# Patient Record
Sex: Female | Born: 1935 | Race: Black or African American | Hispanic: No | Smoking: Former smoker
Health system: Southern US, Community
[De-identification: ages and names within clinical notes are randomized; demographics above are authoritative.]

## PROBLEM LIST (undated history)

## (undated) DIAGNOSIS — N189 Chronic kidney disease, unspecified: Secondary | ICD-10-CM

## (undated) DIAGNOSIS — IMO0001 Reserved for inherently not codable concepts without codable children: Secondary | ICD-10-CM

## (undated) DIAGNOSIS — R1310 Dysphagia, unspecified: Secondary | ICD-10-CM

## (undated) DIAGNOSIS — M8580 Other specified disorders of bone density and structure, unspecified site: Secondary | ICD-10-CM

## (undated) DIAGNOSIS — G629 Polyneuropathy, unspecified: Secondary | ICD-10-CM

## (undated) DIAGNOSIS — G4700 Insomnia, unspecified: Secondary | ICD-10-CM

## (undated) DIAGNOSIS — E785 Hyperlipidemia, unspecified: Secondary | ICD-10-CM

## (undated) DIAGNOSIS — H409 Unspecified glaucoma: Secondary | ICD-10-CM

## (undated) DIAGNOSIS — D649 Anemia, unspecified: Secondary | ICD-10-CM

## (undated) DIAGNOSIS — G2581 Restless legs syndrome: Secondary | ICD-10-CM

## (undated) DIAGNOSIS — K219 Gastro-esophageal reflux disease without esophagitis: Secondary | ICD-10-CM

## (undated) DIAGNOSIS — E559 Vitamin D deficiency, unspecified: Secondary | ICD-10-CM

## (undated) DIAGNOSIS — I10 Essential (primary) hypertension: Secondary | ICD-10-CM

## (undated) DIAGNOSIS — M1990 Unspecified osteoarthritis, unspecified site: Secondary | ICD-10-CM

## (undated) DIAGNOSIS — E119 Type 2 diabetes mellitus without complications: Secondary | ICD-10-CM

## (undated) HISTORY — DX: Insomnia, unspecified: G47.00

## (undated) HISTORY — DX: Gastro-esophageal reflux disease without esophagitis: K21.9

## (undated) HISTORY — DX: Essential (primary) hypertension: I10

## (undated) HISTORY — DX: Unspecified glaucoma: H40.9

## (undated) HISTORY — DX: Other specified disorders of bone density and structure, unspecified site: M85.80

## (undated) HISTORY — DX: Restless legs syndrome: G25.81

## (undated) HISTORY — PX: BREAST LUMPECTOMY: SHX2

## (undated) HISTORY — DX: Polyneuropathy, unspecified: G62.9

## (undated) HISTORY — DX: Chronic kidney disease, unspecified: N18.9

## (undated) HISTORY — DX: Type 2 diabetes mellitus without complications: E11.9

## (undated) HISTORY — DX: Reserved for inherently not codable concepts without codable children: IMO0001

## (undated) HISTORY — DX: Anemia, unspecified: D64.9

## (undated) HISTORY — DX: Dysphagia, unspecified: R13.10

## (undated) HISTORY — PX: OTHER SURGICAL HISTORY: SHX169

## (undated) HISTORY — DX: Unspecified osteoarthritis, unspecified site: M19.90

## (undated) HISTORY — DX: Hyperlipidemia, unspecified: E78.5

## (undated) HISTORY — DX: Vitamin D deficiency, unspecified: E55.9

---

## 1998-01-04 ENCOUNTER — Ambulatory Visit (HOSPITAL_COMMUNITY): Admission: RE | Admit: 1998-01-04 | Discharge: 1998-01-04 | Payer: Self-pay | Admitting: Internal Medicine

## 1998-02-11 ENCOUNTER — Other Ambulatory Visit: Admission: RE | Admit: 1998-02-11 | Discharge: 1998-02-11 | Payer: Self-pay | Admitting: Obstetrics and Gynecology

## 1998-03-11 ENCOUNTER — Other Ambulatory Visit: Admission: RE | Admit: 1998-03-11 | Discharge: 1998-03-11 | Payer: Self-pay | Admitting: Obstetrics and Gynecology

## 1998-07-02 ENCOUNTER — Other Ambulatory Visit: Admission: RE | Admit: 1998-07-02 | Discharge: 1998-07-02 | Payer: Self-pay | Admitting: Obstetrics and Gynecology

## 1999-01-07 ENCOUNTER — Encounter: Payer: Self-pay | Admitting: Internal Medicine

## 1999-01-07 ENCOUNTER — Ambulatory Visit (HOSPITAL_COMMUNITY): Admission: RE | Admit: 1999-01-07 | Discharge: 1999-01-07 | Payer: Self-pay | Admitting: Internal Medicine

## 1999-01-27 ENCOUNTER — Other Ambulatory Visit: Admission: RE | Admit: 1999-01-27 | Discharge: 1999-01-27 | Payer: Self-pay | Admitting: Obstetrics and Gynecology

## 2000-01-13 ENCOUNTER — Encounter: Payer: Self-pay | Admitting: Internal Medicine

## 2000-01-13 ENCOUNTER — Ambulatory Visit (HOSPITAL_COMMUNITY): Admission: RE | Admit: 2000-01-13 | Discharge: 2000-01-13 | Payer: Self-pay | Admitting: Internal Medicine

## 2000-03-01 ENCOUNTER — Other Ambulatory Visit: Admission: RE | Admit: 2000-03-01 | Discharge: 2000-03-01 | Payer: Self-pay | Admitting: Obstetrics and Gynecology

## 2001-01-19 ENCOUNTER — Ambulatory Visit (HOSPITAL_COMMUNITY): Admission: RE | Admit: 2001-01-19 | Discharge: 2001-01-19 | Payer: Self-pay | Admitting: Internal Medicine

## 2001-01-19 ENCOUNTER — Encounter: Payer: Self-pay | Admitting: Internal Medicine

## 2001-03-01 ENCOUNTER — Other Ambulatory Visit: Admission: RE | Admit: 2001-03-01 | Discharge: 2001-03-01 | Payer: Self-pay | Admitting: Obstetrics and Gynecology

## 2002-03-14 ENCOUNTER — Encounter: Admission: RE | Admit: 2002-03-14 | Discharge: 2002-03-14 | Payer: Self-pay | Admitting: Internal Medicine

## 2002-03-14 ENCOUNTER — Encounter: Payer: Self-pay | Admitting: Internal Medicine

## 2002-03-17 ENCOUNTER — Ambulatory Visit (HOSPITAL_COMMUNITY): Admission: RE | Admit: 2002-03-17 | Discharge: 2002-03-17 | Payer: Self-pay | Admitting: Internal Medicine

## 2002-03-17 ENCOUNTER — Encounter: Payer: Self-pay | Admitting: Internal Medicine

## 2002-03-21 ENCOUNTER — Encounter: Payer: Self-pay | Admitting: Internal Medicine

## 2002-03-21 ENCOUNTER — Encounter: Admission: RE | Admit: 2002-03-21 | Discharge: 2002-03-21 | Payer: Self-pay | Admitting: Internal Medicine

## 2002-10-23 ENCOUNTER — Other Ambulatory Visit: Admission: RE | Admit: 2002-10-23 | Discharge: 2002-10-23 | Payer: Self-pay | Admitting: Obstetrics and Gynecology

## 2003-01-31 ENCOUNTER — Encounter: Payer: Self-pay | Admitting: Family Medicine

## 2003-01-31 ENCOUNTER — Encounter: Admission: RE | Admit: 2003-01-31 | Discharge: 2003-01-31 | Payer: Self-pay | Admitting: Family Medicine

## 2003-03-23 ENCOUNTER — Encounter: Admission: RE | Admit: 2003-03-23 | Discharge: 2003-03-23 | Payer: Self-pay | Admitting: Internal Medicine

## 2003-03-23 ENCOUNTER — Encounter: Payer: Self-pay | Admitting: Internal Medicine

## 2003-03-26 ENCOUNTER — Encounter: Payer: Self-pay | Admitting: Family Medicine

## 2003-03-26 ENCOUNTER — Encounter: Admission: RE | Admit: 2003-03-26 | Discharge: 2003-03-26 | Payer: Self-pay | Admitting: Family Medicine

## 2003-09-26 ENCOUNTER — Encounter: Admission: RE | Admit: 2003-09-26 | Discharge: 2003-09-26 | Payer: Self-pay | Admitting: Orthopedic Surgery

## 2003-10-01 ENCOUNTER — Encounter: Admission: RE | Admit: 2003-10-01 | Discharge: 2003-10-01 | Payer: Self-pay | Admitting: Orthopedic Surgery

## 2003-11-22 ENCOUNTER — Other Ambulatory Visit: Admission: RE | Admit: 2003-11-22 | Discharge: 2003-11-22 | Payer: Self-pay | Admitting: Obstetrics and Gynecology

## 2004-03-25 ENCOUNTER — Ambulatory Visit (HOSPITAL_COMMUNITY): Admission: RE | Admit: 2004-03-25 | Discharge: 2004-03-25 | Payer: Self-pay | Admitting: Internal Medicine

## 2004-08-27 ENCOUNTER — Ambulatory Visit (HOSPITAL_COMMUNITY): Admission: RE | Admit: 2004-08-27 | Discharge: 2004-08-27 | Payer: Self-pay | Admitting: Gastroenterology

## 2004-09-09 ENCOUNTER — Ambulatory Visit (HOSPITAL_COMMUNITY): Admission: RE | Admit: 2004-09-09 | Discharge: 2004-09-09 | Payer: Self-pay | Admitting: Gastroenterology

## 2004-10-26 HISTORY — PX: COLONOSCOPY: SHX174

## 2004-12-01 ENCOUNTER — Other Ambulatory Visit: Admission: RE | Admit: 2004-12-01 | Discharge: 2004-12-01 | Payer: Self-pay | Admitting: Obstetrics and Gynecology

## 2005-03-26 ENCOUNTER — Ambulatory Visit (HOSPITAL_COMMUNITY): Admission: RE | Admit: 2005-03-26 | Discharge: 2005-03-26 | Payer: Self-pay | Admitting: Obstetrics and Gynecology

## 2005-12-07 ENCOUNTER — Other Ambulatory Visit: Admission: RE | Admit: 2005-12-07 | Discharge: 2005-12-07 | Payer: Self-pay | Admitting: Obstetrics and Gynecology

## 2006-03-29 ENCOUNTER — Ambulatory Visit (HOSPITAL_COMMUNITY): Admission: RE | Admit: 2006-03-29 | Discharge: 2006-03-29 | Payer: Self-pay | Admitting: Internal Medicine

## 2006-10-29 ENCOUNTER — Ambulatory Visit (HOSPITAL_COMMUNITY): Admission: RE | Admit: 2006-10-29 | Discharge: 2006-10-29 | Payer: Self-pay | Admitting: Ophthalmology

## 2007-01-31 ENCOUNTER — Ambulatory Visit (HOSPITAL_COMMUNITY): Admission: RE | Admit: 2007-01-31 | Discharge: 2007-01-31 | Payer: Self-pay | Admitting: Ophthalmology

## 2007-03-31 ENCOUNTER — Ambulatory Visit (HOSPITAL_COMMUNITY): Admission: RE | Admit: 2007-03-31 | Discharge: 2007-03-31 | Payer: Self-pay | Admitting: Family Medicine

## 2008-02-23 ENCOUNTER — Ambulatory Visit (HOSPITAL_COMMUNITY): Admission: RE | Admit: 2008-02-23 | Discharge: 2008-02-23 | Payer: Self-pay | Admitting: Ophthalmology

## 2008-04-16 ENCOUNTER — Ambulatory Visit (HOSPITAL_COMMUNITY): Admission: RE | Admit: 2008-04-16 | Discharge: 2008-04-16 | Payer: Self-pay | Admitting: Family Medicine

## 2008-12-10 ENCOUNTER — Encounter: Admission: RE | Admit: 2008-12-10 | Discharge: 2008-12-10 | Payer: Self-pay | Admitting: Family Medicine

## 2009-01-22 ENCOUNTER — Encounter: Admission: RE | Admit: 2009-01-22 | Discharge: 2009-01-22 | Payer: Self-pay | Admitting: Family Medicine

## 2009-05-07 ENCOUNTER — Ambulatory Visit (HOSPITAL_COMMUNITY): Admission: RE | Admit: 2009-05-07 | Discharge: 2009-05-07 | Payer: Self-pay | Admitting: Family Medicine

## 2010-01-14 ENCOUNTER — Ambulatory Visit (HOSPITAL_COMMUNITY): Admission: RE | Admit: 2010-01-14 | Discharge: 2010-01-14 | Payer: Self-pay | Admitting: Ophthalmology

## 2010-06-18 ENCOUNTER — Ambulatory Visit (HOSPITAL_COMMUNITY): Admission: RE | Admit: 2010-06-18 | Discharge: 2010-06-18 | Payer: Self-pay | Admitting: Family Medicine

## 2010-11-17 ENCOUNTER — Encounter: Payer: Self-pay | Admitting: Family Medicine

## 2011-03-13 NOTE — Op Note (Signed)
Adrienne Ortiz, Adrienne Ortiz             ACCOUNT NO.:  [id number]   MEDICAL RECORD NO.:  [id number]          PATIENT TYPE:  AMB   LOCATION:  ENDO                         FACILITY:  [facility]   PHYSICIAN:  Anselmo Rod, M.D.  DATE OF BIRTH:  [birth year]   DATE OF PROCEDURE:  08/27/2004  DATE OF DISCHARGE:                                 OPERATIVE REPORT   PROCEDURE PERFORMED:  Screening colonoscopy.   ENDOSCOPIST:  Anselmo Rod, M.D.   INSTRUMENT USED:  Olympus video colonoscope.   INDICATIONS FOR PROCEDURE:  An 77 year old African American female  undergoing a screening colonoscopy to rule out colonic polyps, masses, etc.   PREPROCEDURE PREPARATION:  Informed consent was procured from the patient.  The patient fasted for eight hours prior to the procedure and prepped with a  bottle of magnesium citrate and a gallop of GoLYTELY the night prior to the  procedure.   PREPROCEDURE PHYSICAL:  VITAL SIGNS:  Stable vital signs.  NECK:  Supple.  CHEST:  Clear to auscultation.  CARDIOVASCULAR:  S1 and S2 regular.  ABDOMEN:  Soft with normal bowel sounds.   DESCRIPTION OF PROCEDURE:  The patient was placed in left lateral decubitus  position, sedated with 50 mg of Demerol and 6 mg of Versed in slow  incremental doses.  Once the patient was adequately sedated and maintained  on low flow oxygen and continuous cardiac monitoring, the Olympus video  colonoscope was advanced from the rectum to the proximal right colon with  extreme difficulty.  The patient's position was changed from the left  lateral to supine and right lateral position.  She was then placed in the  prone position but in spite of several maneuvers and several attempts, the  cecum could not be reached.  The rest of the examination of the proximal  right colon appeared normal.  There was significant amount of residual stool  in the colon.  Multiple washings were done.  No masses, polyps, erosions,  ulcerations or diverticula were  identified.  Retroflexion in the rectum  revealed no abnormalities.  The patient tolerated the procedure well without  any immediate complications.   IMPRESSION:  Colonoscopy normal up to the proximal right colon.  Cecal base  not visualized.   RECOMMENDATIONS:  The patient will be reprepped and the ACBE will be  scheduled for a later date.  Further recommendation will be made in follow-  up.       JNM/MEDQ  D:  08/27/2004  T:  08/27/2004  Job:  [id number]   cc:   Merlene Laughter. Renae Gloss, M.D.  [address]  [address]  [place]  [place] [address]  Fax: [phone]

## 2011-03-13 NOTE — Op Note (Signed)
Adrienne Ortiz, Adrienne Ortiz             ACCOUNT NO.:  [id number]   MEDICAL RECORD NO.:  [id number]          PATIENT TYPE:  AMB   LOCATION:  SDS                          FACILITY:  [facility]   PHYSICIAN:  Salley Scarlet., M.D.DATE OF BIRTH:  07/13/1922   DATE OF PROCEDURE:  DATE OF DISCHARGE:  02/23/2008                               OPERATIVE REPORT   PREOPERATIVE DIAGNOSIS:  Immature cataract, left eye.   POSTOPERATIVE DIAGNOSIS:  Immature cataract, left eye.   OPERATION:  Kelman phacoemulsification of cataract, left eye   ANESTHESIA:  Local using Xylocaine 2% with Marcaine 0.75% with Wydase.   JUSTIFICATION FOR PROCEDURE:  This is an 77 year old lady who underwent  a cataract extraction of the right eye several months ago.  She has a  cataract of the left eye with a visual acuity best corrected at 20/60.  She complains of blurring of vision with difficulty seeing to read.  Cataract extraction of the left eye with intraocular lens implantation  was recommended.  She was admitted at this time for that purpose.   PROCEDURE:  Under the influence of IV sedation, a Van Lint akinesia and  retrobulbar anesthesia was given.  The patient was prepared and draped  in usual manner.  The lid speculum was inserted under the upper and  lower lid of the left eye, and a 4-0 silk traction suture was passed  through the belly of the superior rectus muscle for traction.  A fornix-  based conjunctival flap was turned and hemostasis achieved using  cautery.  An incision was made in the sclera at the limbus.  This  incision was dissected down to clear cornea using a crescent blade.  A  sideport incision was made at 1:30 o'clock position.  OcuCoat was  injected into the eye through the sideport incision.  The anterior  chamber was entered through the corneoscleral tunnel incision at 11:30  o'clock position.  OcuCoat was injected into the eye through the  sideport incision.  The anterior chamber was then  entered through the  corneoscleral tunnel incision at 11:30 o'clock position using a 2.75  meter keratome.  An anterior capsulotomy was performed using a bent #25  gauge needle.  The nucleus was hydrodissected using Xylocaine.  The KPE  handpiece was passed into the eye, and the nucleus was emulsified  without difficulty.  The residual cortical material was aspirated.  The  posterior capsule was polished using an olive tip polisher.  The wound  was widened slightly to accommodate a foldable silicone lens.  The lens  was seated into the eye behind the iris without difficulty.  The  anterior chamber was reformed and pupil was constricted using Miochol.  The lips of the wound were hydrated and tested to make sure that there  was no leak.  After ascertaining that there was no leak, the conjunctiva  was closed over wound using thermal cautery.  Celestone 1 mL and 0.50 mL  of gentamicin were injected subconjunctivally.  Maxitrol ophthalmic  ointment and Pontocaine ointment were applied along with a patch and Fox  shield.  The patient tolerated the  procedure well and was discharged to  the post anesthesia recovery room in satisfactory condition.  She was  instructed to rest today, to take Vicodin every 4 hours as needed for  pain, and to see me in office tomorrow for further evaluation.   DISCHARGE DIAGNOSIS:  Immature cataract, left eye.      Salley Scarlet., M.D.  Electronically Signed     TB/MEDQ  D:  02/24/2008  T:  02/24/2008  Job:  [id number]

## 2011-03-13 NOTE — Op Note (Signed)
NAMEHAIDEE, Adrienne Ortiz             ACCOUNT NO.:  [id number]   MEDICAL RECORD NO.:  [id number]          PATIENT TYPE:  AMB   LOCATION:  SDS                          FACILITY:  [facility]   PHYSICIAN:  Salley Scarlet., M.D.DATE OF BIRTH:  June 06, 1922   DATE OF PROCEDURE:  10/30/2006  DATE OF DISCHARGE:  10/29/2006                               OPERATIVE REPORT   HISTORY OF PRESENT ILLNESS:  This is an 77 year old lady who presented  to our office complaining of blurred vision with difficulty seeing to  drive at night.  She was evaluated and found to have a visual acuity  best corrected to 20/70 on the right, 20/60 on the left.  There were  bilateral immature cataracts, slightly worse on the right than the left.  Cataract extraction with intraocular lens implantation was recommended.  She is admitted at this time for the purpose.   PROCEDURE:  Under the influence of IV sedation, the Van Lint akinesia  and retrobulbar anesthesia was given.  The patient was prepped and  draped in the usual manner.  The lid speculum was inserted under the  upper and lower lids of the right eye and a 4-0 silk traction suture was  passed through the belly of the superior rectus muscle.  A fornix based  conjunctival flap was turned and hemostasis was achieved using cautery.  An incision was made in the sclera at the limbus.  This incision was  dissected down into clear cornea using a crescent blade.  A side port  incision was made at the 1:30 o'clock position.  OccuCoat was injected  into the eye through the side point incision.  The anterior chamber was  then entered through the corneoscleral tunnel incision at the 11:30  o'clock position using a 2.75 mm keratome.  An anterior capsulotomy was  done using a bent 25-gauge needle and the nucleus was hydrodissected  using Xylocaine.  The KPE hand piece was passed into the eye and the  nucleus was emulsified without difficulty.  The residual cortical  material  was aspirated.  The posterior capsule was polished using an  olive tip  polisher.  The wound was widened slightly to accommodate a  foldable silicon lens.  The lens was seated into the eye without  difficulty.  The anterior chamber was reformed and the pupil was  constricted using Miochol.  The lips of the wound were hydrated and  tested to make sure that there was no leak.  After ascertaining there  was no leak, the conjunctiva was closed over the wound using cautery.  One cubic centimeter of Celestone, 0.5 mL gentamicin were injected into  the sub conjunctiva.  Maxitrol ophthalmic ointment and Pilopine ointment  were applied along with a patch and Fox shield.  The patient tolerated  the procedure well and was discharged to the postanesthesia recovery  room in satisfactory condition.  She was instructed to rest today, to  take Vicodin every four hours as needed for pain and to see me in my  office for further evaluation.   DISCHARGE DIAGNOSIS:  Immature cataract, right eye.  Salley Scarlet., M.D.  Electronically Signed     TB/MEDQ  D:  10/30/2006  T:  10/30/2006  Job:  [id number]

## 2011-03-17 ENCOUNTER — Other Ambulatory Visit: Payer: Self-pay

## 2011-03-17 ENCOUNTER — Other Ambulatory Visit: Payer: Self-pay | Admitting: Gastroenterology

## 2011-03-19 ENCOUNTER — Ambulatory Visit
Admission: RE | Admit: 2011-03-19 | Discharge: 2011-03-19 | Disposition: A | Discharge status: Home / Self Care | Payer: Medicare Other | Source: Ambulatory Visit | Attending: Gastroenterology | Admitting: Gastroenterology

## 2011-05-12 ENCOUNTER — Other Ambulatory Visit (HOSPITAL_COMMUNITY): Payer: Self-pay | Admitting: Internal Medicine

## 2011-05-12 ENCOUNTER — Other Ambulatory Visit: Payer: Self-pay | Admitting: *Deleted

## 2011-05-12 ENCOUNTER — Other Ambulatory Visit (HOSPITAL_COMMUNITY): Payer: Self-pay | Admitting: *Deleted

## 2011-05-12 DIAGNOSIS — Z1231 Encounter for screening mammogram for malignant neoplasm of breast: Secondary | ICD-10-CM

## 2011-06-22 ENCOUNTER — Ambulatory Visit (HOSPITAL_COMMUNITY)
Admission: RE | Admit: 2011-06-22 | Discharge: 2011-06-22 | Disposition: A | Discharge status: Home / Self Care | Payer: Medicare Other | Source: Ambulatory Visit | Attending: Internal Medicine | Admitting: Internal Medicine

## 2011-06-22 DIAGNOSIS — Z1231 Encounter for screening mammogram for malignant neoplasm of breast: Secondary | ICD-10-CM | POA: Insufficient documentation

## 2011-07-21 LAB — URINALYSIS, ROUTINE W REFLEX MICROSCOPIC
Bilirubin Urine: NEGATIVE
Glucose, UA: NEGATIVE
Hgb urine dipstick: NEGATIVE
Ketones, ur: NEGATIVE
Nitrite: NEGATIVE
Protein, ur: NEGATIVE
Specific Gravity, Urine: 1.008
Urobilinogen, UA: 0.2
pH: 7

## 2011-07-21 LAB — BASIC METABOLIC PANEL
BUN: 30 — ABNORMAL HIGH
CO2: 27
Calcium: 9.5
Chloride: 103
Creatinine, Ser: 1.18
GFR calc Af Amer: 53 — ABNORMAL LOW
GFR calc non Af Amer: 44 — ABNORMAL LOW
Glucose, Bld: 89
Potassium: 4.5
Sodium: 136

## 2011-07-21 LAB — URINE MICROSCOPIC-ADD ON

## 2011-07-21 LAB — CBC
HCT: 35.7 — ABNORMAL LOW
Hemoglobin: 11.6 — ABNORMAL LOW
MCHC: 32.6
MCV: 90.3
Platelets: 317
RBC: 3.95
RDW: 14.3
WBC: 7.4

## 2012-05-30 ENCOUNTER — Other Ambulatory Visit (HOSPITAL_COMMUNITY): Payer: Self-pay | Admitting: Internal Medicine

## 2012-05-30 DIAGNOSIS — Z1231 Encounter for screening mammogram for malignant neoplasm of breast: Secondary | ICD-10-CM

## 2012-06-23 ENCOUNTER — Ambulatory Visit (HOSPITAL_COMMUNITY)
Admission: RE | Admit: 2012-06-23 | Discharge: 2012-06-23 | Disposition: A | Discharge status: Home / Self Care | Payer: Medicare Other | Source: Ambulatory Visit | Attending: Internal Medicine | Admitting: Internal Medicine

## 2012-06-23 DIAGNOSIS — Z1231 Encounter for screening mammogram for malignant neoplasm of breast: Secondary | ICD-10-CM | POA: Insufficient documentation

## 2012-07-20 ENCOUNTER — Ambulatory Visit: Payer: Medicare Other | Attending: Internal Medicine | Admitting: Physical Therapy

## 2012-07-20 DIAGNOSIS — R269 Unspecified abnormalities of gait and mobility: Secondary | ICD-10-CM | POA: Insufficient documentation

## 2012-07-20 DIAGNOSIS — R279 Unspecified lack of coordination: Secondary | ICD-10-CM | POA: Insufficient documentation

## 2012-07-20 DIAGNOSIS — IMO0001 Reserved for inherently not codable concepts without codable children: Secondary | ICD-10-CM | POA: Insufficient documentation

## 2012-07-26 ENCOUNTER — Ambulatory Visit: Payer: Medicare Other | Attending: Internal Medicine | Admitting: Physical Therapy

## 2012-07-26 DIAGNOSIS — IMO0001 Reserved for inherently not codable concepts without codable children: Secondary | ICD-10-CM | POA: Insufficient documentation

## 2012-07-26 DIAGNOSIS — R269 Unspecified abnormalities of gait and mobility: Secondary | ICD-10-CM | POA: Insufficient documentation

## 2012-08-01 ENCOUNTER — Ambulatory Visit: Payer: Medicare Other | Admitting: Physical Therapy

## 2012-08-08 ENCOUNTER — Ambulatory Visit: Payer: Medicare Other | Admitting: Physical Therapy

## 2012-08-15 ENCOUNTER — Ambulatory Visit: Payer: Medicare Other | Admitting: Physical Therapy

## 2012-08-24 ENCOUNTER — Ambulatory Visit: Payer: Medicare Other | Admitting: Physical Therapy

## 2012-08-29 ENCOUNTER — Ambulatory Visit: Payer: Medicare Other | Attending: Internal Medicine | Admitting: *Deleted

## 2012-08-29 DIAGNOSIS — IMO0001 Reserved for inherently not codable concepts without codable children: Secondary | ICD-10-CM | POA: Insufficient documentation

## 2012-08-29 DIAGNOSIS — R269 Unspecified abnormalities of gait and mobility: Secondary | ICD-10-CM | POA: Insufficient documentation

## 2012-09-07 ENCOUNTER — Ambulatory Visit: Payer: Medicare Other | Admitting: Physical Therapy

## 2012-09-16 ENCOUNTER — Ambulatory Visit: Payer: Medicare Other | Admitting: Physical Therapy

## 2012-12-26 ENCOUNTER — Ambulatory Visit: Payer: Medicare Other | Admitting: Physical Therapy

## 2012-12-26 ENCOUNTER — Ambulatory Visit: Payer: Medicare Other | Attending: Internal Medicine | Admitting: Physical Therapy

## 2012-12-26 DIAGNOSIS — R269 Unspecified abnormalities of gait and mobility: Secondary | ICD-10-CM | POA: Insufficient documentation

## 2012-12-26 DIAGNOSIS — IMO0001 Reserved for inherently not codable concepts without codable children: Secondary | ICD-10-CM | POA: Insufficient documentation

## 2012-12-26 DIAGNOSIS — R279 Unspecified lack of coordination: Secondary | ICD-10-CM | POA: Insufficient documentation

## 2013-05-29 ENCOUNTER — Other Ambulatory Visit (HOSPITAL_COMMUNITY): Payer: Self-pay | Admitting: Internal Medicine

## 2013-05-29 DIAGNOSIS — Z1231 Encounter for screening mammogram for malignant neoplasm of breast: Secondary | ICD-10-CM

## 2013-06-27 ENCOUNTER — Ambulatory Visit (HOSPITAL_COMMUNITY): Payer: Medicare Other | Attending: Internal Medicine

## 2013-08-21 ENCOUNTER — Other Ambulatory Visit (HOSPITAL_COMMUNITY): Payer: Self-pay | Admitting: Internal Medicine

## 2013-08-21 DIAGNOSIS — Z1231 Encounter for screening mammogram for malignant neoplasm of breast: Secondary | ICD-10-CM

## 2013-09-07 ENCOUNTER — Ambulatory Visit (HOSPITAL_COMMUNITY)
Admission: RE | Admit: 2013-09-07 | Discharge: 2013-09-07 | Disposition: A | Discharge status: Home / Self Care | Payer: Medicare Other | Source: Ambulatory Visit | Attending: Internal Medicine | Admitting: Internal Medicine

## 2013-09-07 DIAGNOSIS — Z1231 Encounter for screening mammogram for malignant neoplasm of breast: Secondary | ICD-10-CM | POA: Insufficient documentation

## 2014-03-09 ENCOUNTER — Encounter: Payer: Self-pay | Admitting: Podiatry

## 2014-03-09 ENCOUNTER — Ambulatory Visit (INDEPENDENT_AMBULATORY_CARE_PROVIDER_SITE_OTHER): Payer: Medicare Other | Admitting: Podiatry

## 2014-03-09 VITALS — BP 115/70 | HR 56 | Ht 65.0 in | Wt 171.0 lb

## 2014-03-09 DIAGNOSIS — M79606 Pain in leg, unspecified: Secondary | ICD-10-CM

## 2014-03-09 DIAGNOSIS — M79609 Pain in unspecified limb: Secondary | ICD-10-CM

## 2014-03-09 DIAGNOSIS — B351 Tinea unguium: Secondary | ICD-10-CM | POA: Insufficient documentation

## 2014-03-09 NOTE — Progress Notes (Signed)
Subjective: 80 year old female presents requesting toe nails trimmed. Stated that she has neuropathy that hurts her back and lower limbs. She walks with aid of cane.  Also stated that she had an accident and had broken bone on left foot. Since than the foot is not normal.  Objective: Dermatologic: Thick dystrophic nails x 10. Vascular: Right DP is palpable but not left. PT are not palpable. No edema or erythema noted.  Neurologic: Failed to respond to the Monofilament sensory testing. Orthopedic: Severely subluxed mid foot with medially protruding TNJ left foot. Flattened arch both feet. Valgus rotated hallux with bunion on both feet.  Assessment: Onychomycosis x 10. Peripheral Neuropathy. Pain in lower limbs.  Plan: Reviewed findings and debrided all nails. Return in 3 months.

## 2014-03-09 NOTE — Patient Instructions (Signed)
Seen for hypertrophic nails. All nails debrided. Return in 3 months or as needed.  

## 2014-08-08 ENCOUNTER — Other Ambulatory Visit (HOSPITAL_COMMUNITY): Payer: Self-pay | Admitting: Internal Medicine

## 2014-08-08 DIAGNOSIS — Z1231 Encounter for screening mammogram for malignant neoplasm of breast: Secondary | ICD-10-CM

## 2014-09-10 ENCOUNTER — Ambulatory Visit (HOSPITAL_COMMUNITY)
Admission: RE | Admit: 2014-09-10 | Discharge: 2014-09-10 | Disposition: A | Discharge status: Home / Self Care | Payer: Medicare Other | Source: Ambulatory Visit | Attending: Internal Medicine | Admitting: Internal Medicine

## 2014-09-10 DIAGNOSIS — Z1231 Encounter for screening mammogram for malignant neoplasm of breast: Secondary | ICD-10-CM | POA: Diagnosis present

## 2017-04-02 ENCOUNTER — Emergency Department (HOSPITAL_COMMUNITY): Payer: Medicare Other

## 2017-04-02 ENCOUNTER — Encounter (HOSPITAL_COMMUNITY): Payer: Self-pay

## 2017-04-02 ENCOUNTER — Observation Stay (HOSPITAL_COMMUNITY)
Admission: EM | Admit: 2017-04-02 | Discharge: 2017-04-03 | Disposition: A | Discharge status: Home / Self Care | Payer: Medicare Other | Attending: Internal Medicine | Admitting: Internal Medicine

## 2017-04-02 DIAGNOSIS — E1122 Type 2 diabetes mellitus with diabetic chronic kidney disease: Secondary | ICD-10-CM | POA: Insufficient documentation

## 2017-04-02 DIAGNOSIS — N183 Chronic kidney disease, stage 3 (moderate): Secondary | ICD-10-CM | POA: Diagnosis not present

## 2017-04-02 DIAGNOSIS — I10 Essential (primary) hypertension: Secondary | ICD-10-CM

## 2017-04-02 DIAGNOSIS — E876 Hypokalemia: Secondary | ICD-10-CM | POA: Diagnosis present

## 2017-04-02 DIAGNOSIS — Z7982 Long term (current) use of aspirin: Secondary | ICD-10-CM | POA: Insufficient documentation

## 2017-04-02 DIAGNOSIS — I129 Hypertensive chronic kidney disease with stage 1 through stage 4 chronic kidney disease, or unspecified chronic kidney disease: Principal | ICD-10-CM | POA: Insufficient documentation

## 2017-04-02 DIAGNOSIS — D649 Anemia, unspecified: Secondary | ICD-10-CM | POA: Diagnosis not present

## 2017-04-02 DIAGNOSIS — E785 Hyperlipidemia, unspecified: Secondary | ICD-10-CM | POA: Diagnosis present

## 2017-04-02 DIAGNOSIS — E1142 Type 2 diabetes mellitus with diabetic polyneuropathy: Secondary | ICD-10-CM | POA: Diagnosis not present

## 2017-04-02 DIAGNOSIS — G629 Polyneuropathy, unspecified: Secondary | ICD-10-CM

## 2017-04-02 DIAGNOSIS — E119 Type 2 diabetes mellitus without complications: Secondary | ICD-10-CM

## 2017-04-02 DIAGNOSIS — K625 Hemorrhage of anus and rectum: Secondary | ICD-10-CM | POA: Diagnosis present

## 2017-04-02 DIAGNOSIS — K921 Melena: Secondary | ICD-10-CM | POA: Diagnosis present

## 2017-04-02 DIAGNOSIS — E559 Vitamin D deficiency, unspecified: Secondary | ICD-10-CM | POA: Insufficient documentation

## 2017-04-02 DIAGNOSIS — K219 Gastro-esophageal reflux disease without esophagitis: Secondary | ICD-10-CM | POA: Insufficient documentation

## 2017-04-02 DIAGNOSIS — R1310 Dysphagia, unspecified: Secondary | ICD-10-CM | POA: Diagnosis not present

## 2017-04-02 DIAGNOSIS — Z79899 Other long term (current) drug therapy: Secondary | ICD-10-CM | POA: Insufficient documentation

## 2017-04-02 DIAGNOSIS — K529 Noninfective gastroenteritis and colitis, unspecified: Secondary | ICD-10-CM | POA: Diagnosis not present

## 2017-04-02 DIAGNOSIS — N179 Acute kidney failure, unspecified: Secondary | ICD-10-CM | POA: Diagnosis present

## 2017-04-02 DIAGNOSIS — Z87891 Personal history of nicotine dependence: Secondary | ICD-10-CM | POA: Insufficient documentation

## 2017-04-02 LAB — BASIC METABOLIC PANEL
ANION GAP: 7 (ref 5–15)
BUN: 17 mg/dL (ref 6–20)
CHLORIDE: 103 mmol/L (ref 101–111)
CO2: 26 mmol/L (ref 22–32)
Calcium: 9.5 mg/dL (ref 8.9–10.3)
Creatinine, Ser: 1.45 mg/dL — ABNORMAL HIGH (ref 0.44–1.00)
GFR calc Af Amer: 35 mL/min — ABNORMAL LOW (ref >60)
GFR calc non Af Amer: 30 mL/min — ABNORMAL LOW (ref >60)
Glucose, Bld: 129 mg/dL — ABNORMAL HIGH (ref 65–99)
Potassium: 3.2 mmol/L — ABNORMAL LOW (ref 3.5–5.1)
Sodium: 136 mmol/L (ref 135–145)

## 2017-04-02 LAB — CBC WITH DIFFERENTIAL/PLATELET
BASOS PCT: 0 %
Basophils Absolute: 0 10*3/uL (ref 0.0–0.1)
Eosinophils Absolute: 0.5 10*3/uL (ref 0.0–0.7)
Eosinophils Relative: 4 %
HCT: 36.1 % (ref 36.0–46.0)
HEMOGLOBIN: 11.5 g/dL — AB (ref 12.0–15.0)
LYMPHS ABS: 2.5 10*3/uL (ref 0.7–4.0)
Lymphocytes Relative: 21 %
MCH: 29.3 pg (ref 26.0–34.0)
MCHC: 31.9 g/dL (ref 30.0–36.0)
MCV: 91.9 fL (ref 78.0–100.0)
MONOS PCT: 7 %
Monocytes Absolute: 0.8 10*3/uL (ref 0.1–1.0)
Neutro Abs: 8.2 10*3/uL — ABNORMAL HIGH (ref 1.7–7.7)
Neutrophils Relative %: 68 %
Platelets: 207 10*3/uL (ref 150–400)
RBC: 3.93 MIL/uL (ref 3.87–5.11)
RDW: 14.6 % (ref 11.5–15.5)
WBC: 12 10*3/uL — ABNORMAL HIGH (ref 4.0–10.5)

## 2017-04-02 LAB — PHOSPHORUS: Phosphorus: 2.3 mg/dL — ABNORMAL LOW (ref 2.5–4.6)

## 2017-04-02 LAB — LACTIC ACID, PLASMA
Lactic Acid, Venous: 2.1 mmol/L (ref 0.5–1.9)
Lactic Acid, Venous: 1.3 mmol/L (ref 0.5–1.9)

## 2017-04-02 LAB — POC OCCULT BLOOD, ED: Fecal Occult Bld: POSITIVE — AB

## 2017-04-02 MED ORDER — SODIUM CHLORIDE 0.9 % IV SOLN: INTRAVENOUS | Status: DC

## 2017-04-02 MED ORDER — LATANOPROST 0.005 % OP SOLN
1.0000 [drp] | Freq: Every day | OPHTHALMIC | Status: DC
  Administered 2017-04-02: 1 [drp] via OPHTHALMIC
  Filled 2017-04-02: qty 2.5

## 2017-04-02 MED ORDER — DORZOLAMIDE HCL 2 % OP SOLN
1.0000 [drp] | Freq: Two times a day (BID) | OPHTHALMIC | Status: DC
Start: 2017-04-02 — End: 2017-04-03
  Administered 2017-04-02 – 2017-04-03 (×2): 1 [drp] via OPHTHALMIC
  Filled 2017-04-02: qty 10

## 2017-04-02 MED ORDER — SODIUM CHLORIDE 0.9% FLUSH
3.0000 mL | Freq: Two times a day (BID) | INTRAVENOUS | Status: DC
  Administered 2017-04-02: 3 mL via INTRAVENOUS

## 2017-04-02 MED ORDER — ACETAMINOPHEN 325 MG PO TABS: 650.0000 mg | ORAL_TABLET | Freq: Four times a day (QID) | ORAL | Status: DC | PRN

## 2017-04-02 MED ORDER — METRONIDAZOLE IN NACL 5-0.79 MG/ML-% IV SOLN
500.0000 mg | Freq: Three times a day (TID) | INTRAVENOUS | Status: DC
  Administered 2017-04-02 – 2017-04-03 (×3): 500 mg via INTRAVENOUS
  Filled 2017-04-02 (×3): qty 100

## 2017-04-02 MED ORDER — ACETAMINOPHEN 650 MG RE SUPP: 650.0000 mg | Freq: Four times a day (QID) | RECTAL | Status: DC | PRN

## 2017-04-02 MED ORDER — ONDANSETRON HCL 4 MG/2ML IJ SOLN: 4.0000 mg | Freq: Four times a day (QID) | INTRAMUSCULAR | Status: DC | PRN

## 2017-04-02 MED ORDER — ONDANSETRON HCL 4 MG PO TABS: 4.0000 mg | ORAL_TABLET | Freq: Four times a day (QID) | ORAL | Status: DC | PRN

## 2017-04-02 MED ORDER — CIPROFLOXACIN IN D5W 200 MG/100ML IV SOLN
200.0000 mg | Freq: Two times a day (BID) | INTRAVENOUS | Status: DC
  Administered 2017-04-02 – 2017-04-03 (×2): 200 mg via INTRAVENOUS
  Filled 2017-04-02 (×3): qty 100

## 2017-04-02 MED ORDER — POTASSIUM CHLORIDE IN NACL 20-0.9 MEQ/L-% IV SOLN
INTRAVENOUS | Status: DC
  Administered 2017-04-02: 19:00:00 via INTRAVENOUS
  Filled 2017-04-02 (×2): qty 1000

## 2017-04-02 MED ORDER — PANTOPRAZOLE SODIUM 40 MG PO TBEC
40.0000 mg | DELAYED_RELEASE_TABLET | Freq: Every day | ORAL | Status: DC
  Administered 2017-04-03: 40 mg via ORAL
  Filled 2017-04-02: qty 1

## 2017-04-02 NOTE — ED Triage Notes (Signed)
Pt presents with 2 day h/o rectal bleeding.  Pt reports bright red blood even without bowel movements.  Pt denies any rectal pain, reports 2-3 episodes of generalized abdominal pain since onset of bleeding, denies at present; reports episodes of vomiting at onset of bleeding but denies any x yesterday and today.

## 2017-04-02 NOTE — ED Provider Notes (Signed)
[facility]EMERGENCY DEPT Provider Note   CSN: [id number] Arrival date & time: 04/02/17  1053     History   Chief Complaint Chief Complaint  Patient presents with  . Rectal Bleeding    HPI Adrienne Drapelmetha C Mcelroy is a 83 y.o. female.  The history is provided by the patient. No language interpreter was used.  Rectal Bleeding   Adrienne Ortiz is a 83 y.o. female who presents to the Emergency Department complaining of rectal bleeding.  She reports BRBPR that started June 5 in the evening.  She had 2 bloody BMs that night with a few the following day.  Yesterday her last bloody BM was in the afternoon.  No bloody stools since last night.  No chest pain, sob, abdominal pain, nausea, vomiting.  No prior similar sxs.  Her last colonoscopy was over 10 years ago.  She takes a baby aspirin daily.  She lives alone but has family and friends that check on her frequently.  Overall she feels well.   Past Medical History:  Diagnosis Date  . Anemia    OF CHRONIC DISEASE  . Arthritis   . Chronic renal insufficiency   . Diabetes mellitus without complication (HCC)   . Dysphagia   . Glaucoma   . Hyperlipidemia   . Hypertension   . Insomnia   . Osteopenia   . Peripheral neuropathy   . Reflux    HOARSENESS  . Restless leg syndrome   . Vitamin D deficiency     Patient Active Problem List   Diagnosis Date Noted  . Onychomycosis 03/09/2014  . Pain in lower limb 03/09/2014    Past Surgical History:  Procedure Laterality Date  . ABDOMINAL SURGERIES     2 PRIOR?  . BREAST LUMPECTOMY     RIGHT  [facility]  . REPAIR OF TORN LIGAMENT     RIGHT KNEE  . ROTATER CUFF SURGERY      OB History    No data available       Home Medications    Prior to Admission medications   Medication Sig Start Date End Date Taking? Authorizing Provider  acetaminophen (TYLENOL) 325 MG tablet Take 650 mg by mouth 2 (two) times daily as needed.    [provider]  cholecalciferol  (VITAMIN D) 1000 UNITS tablet Take 1,000 Units by mouth daily.    [provider]  dorzolamide (TRUSOPT) 2 % ophthalmic solution Place 1 drop into the right eye 2 (two) times daily.    [provider]  latanoprost (XALATAN) 0.005 % ophthalmic solution Place 1 drop into the right eye at bedtime.    [provider]  omeprazole (PRILOSEC) 40 MG capsule Take 40 mg by mouth daily.    [provider]  Polyethyl Glycol-Propyl Glycol (SYSTANE) 0.4-0.3 % SOLN Apply to eye as needed.    [provider]  quinapril-hydrochlorothiazide (ACCURETIC) 10-12.5 MG per tablet Take 1 tablet by mouth daily.    [provider]  simvastatin (ZOCOR) 20 MG tablet Take 20 mg by mouth daily.    [provider]    Family History Family History  Problem Relation Age of Onset  . Cancer Mother   . Heart disease Father   . Hypertension Father   . Lung cancer Sister   . Throat cancer Brother   . Throat cancer Brother     Social History Social History  Substance Use Topics  . Smoking status: Former Games developermoker  .  Smokeless tobacco: Never Used  . Alcohol use Not on file     Allergies   Aspirin; Celebrex [celecoxib]; Oxaprozin; and Penicillins   Review of Systems Review of Systems  Gastrointestinal: Positive for hematochezia.  All other systems reviewed and are negative.    Physical Exam Updated Vital Signs BP (!) 102/55   Pulse (!) 51   Temp 98.1 F (36.7 C) (Oral)   Resp 18   Ht 5\' 6"  (1.676 m)   Wt 77.1 kg (170 lb)   SpO2 99%   BMI 27.44 kg/m   Physical Exam  Constitutional: She is oriented to person, place, and time. She appears well-developed and well-nourished.  HENT:  Head: Normocephalic and atraumatic.  Cardiovascular: Normal rate and regular rhythm.   No murmur heard. Pulmonary/Chest: Effort normal and breath sounds normal. No respiratory distress.  Abdominal: Soft. There is no tenderness. There is no rebound and no guarding.    Genitourinary:  Genitourinary Comments: Trace amount of gross blood on rectal.  No significant tenderness or hemorrhoids.   Musculoskeletal: She exhibits no tenderness.  Trace pitting edema to BLE  Neurological: She is alert and oriented to person, place, and time.  Skin: Skin is warm and dry.  Psychiatric: She has a normal mood and affect. Her behavior is normal.  Nursing note and vitals reviewed.    ED Treatments / Results  Labs (all labs ordered are listed, but only abnormal results are displayed) Labs Reviewed  BASIC METABOLIC PANEL - Abnormal; Notable for the following:       Result Value   Potassium 3.2 (*)    Glucose, Bld 129 (*)    Creatinine, Ser 1.45 (*)    GFR calc non Af Amer 30 (*)    GFR calc Af Amer 35 (*)    All other components within normal limits  CBC WITH DIFFERENTIAL/PLATELET - Abnormal; Notable for the following:    WBC 12.0 (*)    Hemoglobin 11.5 (*)    Neutro Abs 8.2 (*)    All other components within normal limits  POC OCCULT BLOOD, ED - Abnormal; Notable for the following:    Fecal Occult Bld POSITIVE (*)    All other components within normal limits    EKG  EKG Interpretation None       Radiology No results found.  Procedures Procedures (including critical care time)  Medications Ordered in ED Medications - No data to display   Initial Impression / Assessment and Plan / ED Course  I have reviewed the triage vital signs and the nursing notes.  Pertinent labs & imaging results that were available during my care of the patient were reviewed by me and considered in my medical decision making (see chart for details).     Patient here for evaluation of hematochezia that is been intermittent since June 5. She has not had any bloody stools today. Possible trace blood on examination. She has no symptoms of anemia and no abdominal tenderness or abdominal pain. Labs demonstrated a stable hemoglobin with mild elevation in white blood cell  count. She has stable renal insufficiency. Discussed with patient option of observation in the hospital versus discharge home with close return precautions. She requests discharge home with outpatient follow-up. Discussed the importance of close return precautions for any new or worsening symptoms.  Final Clinical Impressions(s) / ED Diagnoses   Final diagnoses:  Hematochezia    New Prescriptions New Prescriptions   No medications on file     Madilyn Hook,  Lanora Manis, MD 04/02/17 1341

## 2017-04-02 NOTE — Discharge Instructions (Signed)
Get rechecked immediately if you develop worsening bleeding, abdominal pain, shortness of breath, dizziness, weakness, new or worrisome symptoms.

## 2017-04-02 NOTE — H&P (Signed)
History and Physical    Adrienne Ortiz DOB: 1922-04-13 DOA: 04/02/2017   PCP: Renford Dills, MD   Patient coming from/Resides with: Private residence/lives alone  Admission status: Observation/telemetry -may be medically necessary to stay a minimum 2 midnights to rule out impending and/or unexpected changes in physiologic status that may differ from initial evaluation performed in the ER and/or at time of admission-consider reevaluation of admission status in 24 hours.   Chief Complaint: Rectal bleeding  HPI: Adrienne Ortiz is a 83 y.o. female with medical history significant for diet-controlled diabetes, hypertension, and peripheral neuropathy. Patient presented to the ER after 2 days of stuttering rectal bleeding. Just prior to onset of rectal bleeding patient had diffuse abdominal pain and 1 episode of nonbloody emesis. She has not had any fevers or chills. She has not been on antibiotics recently. Because of the emesis she stopped her oral medications and limited her diet. Stools were Hemoccult positive in the ER. She had mild leukocytosis. CT of abdomen revealed focal colitis at the splenic flexure suggestive of ischemic etiology although infectious colitis could also had similar appearance. She also was noted to have cholelithiasis without cholecystitis. GI consultation pending. No further bleeding from the rectum in the past 24 hours.  ED Course:  Vital Signs: BP 123/62   Pulse (!) 54   Temp 98.1 F (36.7 C) (Oral)   Resp 14   Ht 5\' 6"  (1.676 m)   Wt 77.1 kg (170 lb)   SpO2 100%   BMI 27.44 kg/m  CT abdomen and pelvis: As above Lab data: Sodium 136, potassium 3.2, chloride 103, CO2 26, glucose 129, BUN 17, creatinine 1.45, anion gap 7, white count 12,000 with neutrophils 68% and absolute neutrophils 8.2%, hemoglobin 11.5, platelets 207,000, FOB + Medications and treatments: None  Review of Systems:  In addition to the HPI above,  No Fever-chills, myalgias  or other constitutional symptoms No Headache, changes with Vision or hearing, new weakness, tingling, numbness in any extremity, dizziness, dysarthria or word finding difficulty, gait disturbance or imbalance, tremors or seizure activity No problems swallowing food or Liquids, indigestion/reflux, choking or coughing while eating, abdominal pain with or after eating No Chest pain, Cough or Shortness of Breath, palpitations, orthopnea or DOE No constipation No dysuria, malodorous urine, hematuria or flank pain No new skin rashes, lesions, masses or bruises, No new joint pains, aches, swelling or redness No recent unintentional weight gain or loss No polyuria, polydypsia or polyphagia   Past Medical History:  Diagnosis Date  . Anemia    OF CHRONIC DISEASE  . Arthritis   . Chronic renal insufficiency   . Diabetes mellitus without complication (HCC)   . Dysphagia   . Glaucoma   . Hyperlipidemia   . Hypertension   . Insomnia   . Osteopenia   . Peripheral neuropathy   . Reflux    HOARSENESS  . Restless leg syndrome   . Vitamin D deficiency     Past Surgical History:  Procedure Laterality Date  . ABDOMINAL SURGERIES     2 PRIOR?  . BREAST LUMPECTOMY     RIGHT  . COLONOSCOPY  2006   [facility]  . REPAIR OF TORN LIGAMENT     RIGHT KNEE  . ROTATER CUFF SURGERY      Social History   Social History  . Marital status: Widowed    Spouse name: N/A  . Number of children: N/A  . Years of education: N/A  Occupational History  . Not on file.   Social History Main Topics  . Smoking status: Former Games developer  . Smokeless tobacco: Never Used  . Alcohol use Not on file  . Drug use: Unknown  . Sexual activity: Not on file   Other Topics Concern  . Not on file   Social History Narrative  . No narrative on file    Mobility: Rolling walker or cane Work history: Not obtained   Allergies  Allergen Reactions  . Celebrex [Celecoxib] Swelling  . Oxaprozin Swelling  .  Penicillins Itching    Has patient had a PCN reaction causing immediate rash, facial/tongue/throat swelling, SOB or lightheadedness with hypotension: No Has patient had a PCN reaction causing severe rash involving mucus membranes or skin necrosis: No Has patient had a PCN reaction that required hospitalization: No Has patient had a PCN reaction occurring within the last 10 years: No If all of the above answers are "NO", then may proceed with Cephalosporin use.  . Aspirin Other (See Comments)    Uncoated clear liquid come up in her mouth if taken on an empty stomach    Family History  Problem Relation Age of Onset  . Cancer Mother   . Heart disease Father   . Hypertension Father   . Lung cancer Sister   . Throat cancer Brother   . Throat cancer Brother      Prior to Admission medications   Medication Sig Start Date End Date Taking? Authorizing Provider  acetaminophen (TYLENOL) 325 MG tablet Take 650 mg by mouth 2 (two) times daily as needed.    [provider]  cholecalciferol (VITAMIN D) 1000 UNITS tablet Take 1,000 Units by mouth daily.    [provider]  dorzolamide (TRUSOPT) 2 % ophthalmic solution Place 1 drop into the right eye 2 (two) times daily.    [provider]  latanoprost (XALATAN) 0.005 % ophthalmic solution Place 1 drop into the right eye at bedtime.    [provider]  omeprazole (PRILOSEC) 40 MG capsule Take 40 mg by mouth daily.    [provider]  Polyethyl Glycol-Propyl Glycol (SYSTANE) 0.4-0.3 % SOLN Apply to eye as needed.    [provider]  quinapril-hydrochlorothiazide (ACCURETIC) 10-12.5 MG per tablet Take 1 tablet by mouth daily.    [provider]  simvastatin (ZOCOR) 20 MG tablet Take 20 mg by mouth daily.    [provider]    Physical Exam: Vitals:   04/02/17 1400 04/02/17 1415 04/02/17 1430 04/02/17 1530  BP: (!) 115/58 (!) 110/95 (!) 118/55 123/62  Pulse: (!) 54 (!) 54 (!) 55  (!) 54  Resp:    14  Temp:      TempSrc:      SpO2: 100% 99% 98% 100%  Weight:      Height:          Constitutional: NAD, calm, comfortable-Appears younger than stated age Eyes: PERRL, lids and conjunctivae normal ENMT: Mucous membranes are moist. Posterior pharynx clear of any exudate or lesions.Normal dentition.  Neck: normal, supple, no masses, no thyromegaly Respiratory: clear to auscultation bilaterally, no wheezing, no crackles. Normal respiratory effort. No accessory muscle use.  Cardiovascular: Regular rate and rhythm, no murmurs / rubs / gallops. No extremity edema. 2+ pedal pulses. No carotid bruits.  Abdomen: Mild focal tenderness just above the left lower quadrant without guarding or rebounding, no masses palpated. No hepatosplenomegaly. Bowel sounds positive.  Musculoskeletal: no clubbing / cyanosis. No joint  deformity upper and lower extremities. Good ROM, no contractures. Normal muscle tone.  Skin: no rashes, lesions, ulcers. No induration Neurologic: CN 2-12 grossly intact. Sensation intact, DTR normal. Strength 5/5 x all 4 extremities.  Psychiatric: Normal judgment and insight. Alert and oriented x 3. Normal mood.    Labs on Admission: I have personally reviewed following labs and imaging studies  CBC:  Recent Labs Lab 04/02/17 1202  WBC 12.0*  NEUTROABS 8.2*  HGB 11.5*  HCT 36.1  MCV 91.9  PLT 207   Basic Metabolic Panel:  Recent Labs Lab 04/02/17 1202  NA 136  K 3.2*  CL 103  CO2 26  GLUCOSE 129*  BUN 17  CREATININE 1.45*  CALCIUM 9.5   GFR: Estimated Creatinine Clearance: 25.9 mL/min (A) (by C-G formula based on SCr of 1.45 mg/dL (H)). Liver Function Tests: No results for input(s): AST, ALT, ALKPHOS, BILITOT, PROT, ALBUMIN in the last 168 hours. No results for input(s): LIPASE, AMYLASE in the last 168 hours. No results for input(s): AMMONIA in the last 168 hours. Coagulation Profile: No results for input(s): INR, PROTIME in the last 168  hours. Cardiac Enzymes: No results for input(s): CKTOTAL, CKMB, CKMBINDEX, TROPONINI in the last 168 hours. BNP (last 3 results) No results for input(s): PROBNP in the last 8760 hours. HbA1C: No results for input(s): HGBA1C in the last 72 hours. CBG: No results for input(s): GLUCAP in the last 168 hours. Lipid Profile: No results for input(s): CHOL, HDL, LDLCALC, TRIG, CHOLHDL, LDLDIRECT in the last 72 hours. Thyroid Function Tests: No results for input(s): TSH, T4TOTAL, FREET4, T3FREE, THYROIDAB in the last 72 hours. Anemia Panel: No results for input(s): VITAMINB12, FOLATE, FERRITIN, TIBC, IRON, RETICCTPCT in the last 72 hours. Urine analysis:    Component Value Date/Time   COLORURINE YELLOW 02/20/2008 1417   APPEARANCEUR CLEAR 02/20/2008 1417   LABSPEC 1.008 02/20/2008 1417   PHURINE 7.0 02/20/2008 1417   GLUCOSEU NEGATIVE 02/20/2008 1417   HGBUR NEGATIVE 02/20/2008 1417   BILIRUBINUR NEGATIVE 02/20/2008 1417   KETONESUR NEGATIVE 02/20/2008 1417   PROTEINUR NEGATIVE 02/20/2008 1417   UROBILINOGEN 0.2 02/20/2008 1417   NITRITE NEGATIVE 02/20/2008 1417   LEUKOCYTESUR SMALL (A) 02/20/2008 1417   Sepsis Labs: @LABRCNTIP (procalcitonin:4,lacticidven:4) )No results found for this or any previous visit (from the past 240 hour(s)).   Radiological Exams on Admission: Ct Abdomen Pelvis Wo Contrast  Result Date: 04/02/2017 CLINICAL DATA:  Rectal bleeding for 2 days with nausea and vomiting EXAM: CT ABDOMEN AND PELVIS WITHOUT CONTRAST TECHNIQUE: Multidetector CT imaging of the abdomen and pelvis was performed following the standard protocol without IV contrast. COMPARISON:  None. FINDINGS: Lower chest:  No acute finding Hepatobiliary: No focal liver abnormality.Subtle layering cholelithiasis. No evidence of biliary inflammation or obstruction. Pancreas: Unremarkable. Spleen: Unremarkable. Adrenals/Urinary Tract: Thickened appearance of the left adrenal gland without discrete mass. Two left  renal lesions appear cystic density. The larger measures up to 27 mm. Left upper pole renal cortical scarring. No hydronephrosis or ureteral calculus. Unremarkable bladder. Stomach/Bowel: Thickened appearance of the colon centered on the splenic flexure, with submucosal low density edematous appearance. Subtle but convincing pericolonic edema at this level. No bowel obstruction. Suspect appendectomy. No notable diverticulosis. Vascular/Lymphatic: No acute vascular abnormality. Diffuse atherosclerotic calcification. No mass or adenopathy. Reproductive:Hysterectomy.  Negative adnexa Other: No ascites or pneumoperitoneum. Musculoskeletal: No acute abnormalities. Advanced lumbar disc and facet degeneration with grade 1 anterolisthesis at L4-5, there is also left foramina L5 compression IMPRESSION: 1. Colitis centered at  the splenic flexure suggesting ischemic etiology. Infectious colitis could also have this appearance. 2. Cholelithiasis without cholecystitis. 3.  Aortic Atherosclerosis (ICD10-I70.0). Electronically Signed   By: Marnee SpringJonathon  Watts M.D.   On: 04/02/2017 14:57    EKG: (Independently reviewed) sinus rhythm with ventricular rate 59 bpm, QTC 446 ms, no acute ischemic changes, early R-wave transition  Assessment/Plan Principal Problem:   Rectal bleeding/Colitis -Patient presents with 2 days rectal bleeding preceded by nonbloody emesis with last bleeding episode greater than 24 hours-associated with mild left lower quadrant abdominal pain and findings of focal colitis on CT -Differential includes infectious versus ischemic etiology -Formal GI consultation pending-notified by EDP -Clear liquids-advance diet at discretion of GI service -Begin IV Cipro and Flagyl -No recent antibiotics so doubt C. difficile colitis -Obtain gastrointestinal PCR panel  Active Problems:   Acute hypokalemia -Add potassium to maintenance fluid -Likely secondary to GI losses and preadmission HCTZ    Acute renal  failure superimposed on stage 3 chronic kidney disease  -Secondary to low perfusion from acute GI bleeding as well as preadmission quinapril and HCTZ -Hold above medications -Follow labs -Baseline (2009): 30 and 1.18 -Current: 17 and 1.45    Hypertension -Current blood pressure controlled -Holding antihypertensive agents secondary to AKI as well as GI bleeding    Diabetes mellitus without complication/Peripheral neuropathy -Reports is diet controlled -HgbA1c -On Neurontin prior to admission    Dysphagia -Resolved according to patient    Anemia -Hemoglobin stable at 11.5 with normal MCV    Hyperlipidemia -Hold preadmission statin until diet advanced       DVT prophylaxis: SCDs  Code Status: Full Family Communication: No family at bedside  Disposition Plan: Home Consults called: Gastroenterology/[place]    Russella DarELLIS,Ezra Denne L. ANP-BC [facility] Pager [phone]   If 7PM-7AM, please contact night-coverage [email or web address] Password [email or web address]  04/02/2017, 3:55 PM

## 2017-04-02 NOTE — ED Notes (Signed)
Pt transported back to room from CT scanner on stretcher with tech, tolerated well. 

## 2017-04-02 NOTE — ED Notes (Signed)
Patient transported to CT 

## 2017-04-03 DIAGNOSIS — N183 Chronic kidney disease, stage 3 (moderate): Secondary | ICD-10-CM | POA: Diagnosis not present

## 2017-04-03 DIAGNOSIS — K625 Hemorrhage of anus and rectum: Secondary | ICD-10-CM

## 2017-04-03 DIAGNOSIS — E876 Hypokalemia: Secondary | ICD-10-CM

## 2017-04-03 DIAGNOSIS — K529 Noninfective gastroenteritis and colitis, unspecified: Secondary | ICD-10-CM | POA: Diagnosis not present

## 2017-04-03 DIAGNOSIS — N179 Acute kidney failure, unspecified: Secondary | ICD-10-CM

## 2017-04-03 DIAGNOSIS — E119 Type 2 diabetes mellitus without complications: Secondary | ICD-10-CM

## 2017-04-03 LAB — CBC
HEMATOCRIT: 31.3 % — AB (ref 36.0–46.0)
HEMOGLOBIN: 10.1 g/dL — AB (ref 12.0–15.0)
MCH: 29.7 pg (ref 26.0–34.0)
MCHC: 32.3 g/dL (ref 30.0–36.0)
MCV: 92.1 fL (ref 78.0–100.0)
Platelets: 194 10*3/uL (ref 150–400)
RBC: 3.4 MIL/uL — AB (ref 3.87–5.11)
RDW: 14.7 % (ref 11.5–15.5)
WBC: 9.4 10*3/uL (ref 4.0–10.5)

## 2017-04-03 LAB — BASIC METABOLIC PANEL
ANION GAP: 8 (ref 5–15)
BUN: 12 mg/dL (ref 6–20)
CO2: 25 mmol/L (ref 22–32)
Calcium: 9 mg/dL (ref 8.9–10.3)
Chloride: 107 mmol/L (ref 101–111)
Creatinine, Ser: 1.1 mg/dL — ABNORMAL HIGH (ref 0.44–1.00)
GFR calc non Af Amer: 42 mL/min — ABNORMAL LOW (ref >60)
GFR, EST AFRICAN AMERICAN: 49 mL/min — AB (ref >60)
Glucose, Bld: 100 mg/dL — ABNORMAL HIGH (ref 65–99)
POTASSIUM: 3.2 mmol/L — AB (ref 3.5–5.1)
SODIUM: 140 mmol/L (ref 135–145)

## 2017-04-03 LAB — HEMOGLOBIN A1C
Hgb A1c MFr Bld: 6 % — ABNORMAL HIGH (ref 4.8–5.6)
Mean Plasma Glucose: 126 mg/dL

## 2017-04-03 LAB — MAGNESIUM: MAGNESIUM: 1.7 mg/dL (ref 1.7–2.4)

## 2017-04-03 MED ORDER — METRONIDAZOLE 500 MG PO TABS: 500.0000 mg | ORAL_TABLET | Freq: Three times a day (TID) | ORAL | 0 refills | Status: AC

## 2017-04-03 MED ORDER — CIPROFLOXACIN HCL 500 MG PO TABS: 500.0000 mg | ORAL_TABLET | Freq: Two times a day (BID) | ORAL | 0 refills | Status: AC

## 2017-04-03 MED ORDER — POTASSIUM CHLORIDE CRYS ER 20 MEQ PO TBCR
40.0000 meq | EXTENDED_RELEASE_TABLET | Freq: Once | ORAL | Status: AC
Start: 2017-04-03 — End: 2017-04-03
  Administered 2017-04-03: 40 meq via ORAL
  Filled 2017-04-03: qty 2

## 2017-04-03 MED ORDER — ONDANSETRON 4 MG PO TBDP: 4.0000 mg | ORAL_TABLET | Freq: Three times a day (TID) | ORAL | 0 refills | Status: AC | PRN

## 2017-04-03 MED ORDER — QUINAPRIL-HYDROCHLOROTHIAZIDE 10-12.5 MG PO TABS: 1.0000 | ORAL_TABLET | Freq: Every day | ORAL | Status: AC

## 2017-04-03 MED ORDER — DIPHENHYDRAMINE HCL 25 MG PO CAPS
25.0000 mg | ORAL_CAPSULE | Freq: Once | ORAL | Status: AC
  Administered 2017-04-03: 25 mg via ORAL
  Filled 2017-04-03: qty 1

## 2017-04-03 NOTE — Care Management Note (Signed)
Case Management Note  Patient Details  Name: Adrienne Ortiz MRN: [id number] Date of Birth: 1922-06-02  Subjective/Objective:     Pt admitted with rectal bleeding               Action/Plan:   PTA independent from home alone, pt is a very active 83 year old.  Pt states she only uses a walker when outside of the home - she is very cautious when mobilizing in the home and has a cane if needed.  Friend will transport pt home and help out if needed post discharge.  Pt has PCP and denied barrier to obtaining prescribed meds.  No CM needs identified prior to discharge   Expected Discharge Date:  04/03/17               Expected Discharge Plan:  Home/Self Care  In-House Referral:     Discharge planning Services  CM Consult  Post Acute Care Choice:    Choice offered to:     DME Arranged:    DME Agency:     HH Arranged:    HH Agency:     Status of Service:  Completed, signed off  If discussed at [employer] Length of Stay Meetings, dates discussed:    Additional Comments:  Cherylann ParrClaxton, Taresa Montville S, RN 04/03/2017, 10:32 AM

## 2017-04-03 NOTE — Progress Notes (Signed)
Adrienne DrapeAlmetha C Ortiz to be D/C'd Home per MD order.  Discussed with the patient and all questions fully answered.  VSS, Skin clean, dry and intact without evidence of skin break down, no evidence of skin tears noted. IV catheter discontinued intact. Site without signs and symptoms of complications. Dressing and pressure applied.  An After Visit Summary was printed and given to the patient. Patient received prescription.  D/c education completed with patient/family including follow up instructions, medication list, d/c activities limitations if indicated, with other d/c instructions as indicated by MD - patient able to verbalize understanding, all questions fully answered.   Patient instructed to return to ED, call 911, or call MD for any changes in condition.   Patient escorted via WC, and D/C home via private auto.  Adrienne Ortiz 04/03/2017 1:32 PM

## 2017-04-03 NOTE — Discharge Summary (Signed)
Physician Discharge Summary   Patient ID: Adrienne Ortiz MRN: [id number] DOB/AGE: [birth year] 81 y.o.  Admit date: 04/02/2017 Discharge date: 04/03/2017  Primary Care Physician:  Renford DillsPolite, Ronald, MD  Discharge Diagnoses:    . Hypertension . Anemia . Hyperlipidemia . Rectal bleeding Secondary to focal colitis . Acute hypokalemia . Colitis . Acute renal failure superimposed on stage 3 chronic kidney disease (HCC)   Consults:  Gastroenterology  Recommendations for Outpatient Follow-up:  1. Please repeat CBC/BMET at next visit 2. Patient was recommended to hold quinapril and HCTZ for one week.   DIET: Soft bland diet    Allergies:   Allergies  Allergen Reactions  . Celebrex [Celecoxib] Swelling  . Oxaprozin Swelling  . Penicillins Itching    Has patient had a PCN reaction causing immediate rash, facial/tongue/throat swelling, SOB or lightheadedness with hypotension: No Has patient had a PCN reaction causing severe rash involving mucus membranes or skin necrosis: No Has patient had a PCN reaction that required hospitalization: No Has patient had a PCN reaction occurring within the last 10 years: No If all of the above answers are "NO", then may proceed with Cephalosporin use.  . Aspirin Other (See Comments)    Uncoated clear liquid come up in her mouth if taken on an empty stomach     DISCHARGE MEDICATIONS: Current Discharge Medication List    START taking these medications   Details  ciprofloxacin (CIPRO) 500 MG tablet Take 1 tablet (500 mg total) by mouth 2 (two) times daily. X 10 DAYS Qty: 20 tablet, Refills: 0    metroNIDAZOLE (FLAGYL) 500 MG tablet Take 1 tablet (500 mg total) by mouth 3 (three) times daily. X 10 DAYS Qty: 30 tablet, Refills: 0    ondansetron (ZOFRAN ODT) 4 MG disintegrating tablet Take 1 tablet (4 mg total) by mouth every 8 (eight) hours as needed for nausea or vomiting. Qty: 20 tablet, Refills: 0      CONTINUE these medications which  have CHANGED   Details  quinapril-hydrochlorothiazide (ACCURETIC) 10-12.5 MG tablet Take 1 tablet by mouth daily. HOLD for a week.      CONTINUE these medications which have NOT CHANGED   Details  acetaminophen (TYLENOL) 325 MG tablet Take 650 mg by mouth 2 (two) times daily as needed for moderate pain (arthiritis).     brinzolamide (AZOPT) 1 % ophthalmic suspension Place 1 drop into the right eye 3 (three) times daily.    cholecalciferol (VITAMIN D) 1000 UNITS tablet Take 1,000 Units by mouth daily.    latanoprost (XALATAN) 0.005 % ophthalmic solution Place 1 drop into the right eye at bedtime.    omeprazole (PRILOSEC) 40 MG capsule Take 40 mg by mouth daily.    Polyethyl Glycol-Propyl Glycol (SYSTANE) 0.4-0.3 % SOLN Apply to eye as needed.    simvastatin (ZOCOR) 20 MG tablet Take 20 mg by mouth at bedtime.     timolol (BETIMOL) 0.5 % ophthalmic solution Place 1 drop into the right eye 2 (two) times daily.      STOP taking these medications     dorzolamide (TRUSOPT) 2 % ophthalmic solution          Brief H and P: For complete details please refer to admission H and P, but in brief The patient is a 83 year old female with diet controlled diabetes, hypertension, peripheral neuropathy presented with 2 days of stuttering rectal bleeding, just prior to rectal bleeding patient had diffuse abdominal pain with 1 episode of nonbloody emesis. No fevers or  chills. No recent antibiotics. Because of the emesis, patient stopped her oral medications and limited her diet. Stools were Hemoccult positive in ED. CT of the abdomen showed focal colitis at the splenic flexure suggesting ischemic etiology although infectious colitis couldn't have similar appearance. Patient had no further bleeding from the rectum in the past 24 hours prior to admission.  Hospital Course:     Rectal bleeding/ focal colitis ischemic versus infectious - Patient presented with 2 days off stuttering rectal bleeding  preceded by nonbloody emesis. Patient also had mild abdominal pain with emesis with findings of focal colitis on the CT. No recent antibiotics. -Patient was placed on IV ciprofloxacin and Flagyl, clear liquid diet and IV fluids. - GI was consulted. - Hemoglobin remained stable, patient had no bleeding from the rectum in the past 24 hours prior to admission and no further bleeding during the hospitalization. The patient had a difficult stay as she reported that her son had died 3 months ago in [facility], now herself being admitted brought back memories and caused posttraumatic syndrome and patient became very tearful and crying. She did not want to stay any longer in the hospital. I discussed in detail with patient, her daughter and [facility] gastroenterology, Dr. Levora Angel. Diet was advanced to soft bland diet. Gastroenterology recommended continuing oral antibiotics and Dr Levora Angel will arrange outpatient follow-up with himself in next week or so. The plan was explained to the patient and her daughter who is in agreement. Patient will be discharged after she tolerates the solid diet.  Acute hypokalemia -Likely secondary to GI losses and preadmission HCTZ - Potassium was replaced. Patient was recommended to hold quinapril and HCTZ for one week.    Acute renal failure superimposed on stage 3 chronic kidney disease  -Secondary to low perfusion from acute GI bleeding as well as preadmission quinapril and HCTZ - Currently resolved, creatinine 1.1 at the time of discharge. Patient was given IV fluid hydration. - Patient was recommended to hold quinapril and HCTZ for one week.    Hypertension -BP currently controlled. Placed on IV fluids. Patient was recommended to hold quinapril and HCTZ for one week.     Diabetes mellitus without complication/Peripheral neuropathy -Reports is diet controlled -HgbA1c 6.0 -On Neurontin prior to admission    Dysphagia -Resolved according to patient     Anemia -Currently stable, hemoglobin 10.1 at the time of discharge.    Hyperlipidemia -May resume statin at the discharge  Day of Discharge BP (!) 120/52   Pulse 63   Temp 98.8 F (37.1 C) (Oral)   Resp 18   Ht 5\' 6"  (1.676 m)   Wt 76.2 kg (168 lb)   SpO2 100%   BMI 27.12 kg/m   Physical Exam: General: Alert and awake oriented x3 not in any acute distress. HEENT: anicteric sclera, pupils reactive to light and accommodation CVS: S1-S2 clear no murmur rubs or gallops Chest: clear to auscultation bilaterally, no wheezing rales or rhonchi Abdomen: soft nontender, nondistended, normal bowel sounds Extremities: no cyanosis, clubbing or edema noted bilaterally Neuro: Cranial nerves II-XII intact, no focal neurological deficits   The results of significant diagnostics from this hospitalization (including imaging, microbiology, ancillary and laboratory) are listed below for reference.    LAB RESULTS: Basic Metabolic Panel:  Recent Labs Lab 04/02/17 1202 04/02/17 1913 04/03/17 0526  NA 136  --  140  K 3.2*  --  3.2*  CL 103  --  107  CO2 26  --  25  GLUCOSE 129*  --  100*  BUN 17  --  12  CREATININE 1.45*  --  1.10*  CALCIUM 9.5  --  9.0  MG  --   --  1.7  PHOS  --  2.3*  --    Liver Function Tests: No results for input(s): AST, ALT, ALKPHOS, BILITOT, PROT, ALBUMIN in the last 168 hours. No results for input(s): LIPASE, AMYLASE in the last 168 hours. No results for input(s): AMMONIA in the last 168 hours. CBC:  Recent Labs Lab 04/02/17 1202 04/03/17 0526  WBC 12.0* 9.4  NEUTROABS 8.2*  --   HGB 11.5* 10.1*  HCT 36.1 31.3*  MCV 91.9 92.1  PLT 207 194   Cardiac Enzymes: No results for input(s): CKTOTAL, CKMB, CKMBINDEX, TROPONINI in the last 168 hours. BNP: Invalid input(s): POCBNP CBG: No results for input(s): GLUCAP in the last 168 hours.  Significant Diagnostic Studies:  Ct Abdomen Pelvis Wo Contrast  Result Date: 04/02/2017 CLINICAL DATA:  Rectal  bleeding for 2 days with nausea and vomiting EXAM: CT ABDOMEN AND PELVIS WITHOUT CONTRAST TECHNIQUE: Multidetector CT imaging of the abdomen and pelvis was performed following the standard protocol without IV contrast. COMPARISON:  None. FINDINGS: Lower chest:  No acute finding Hepatobiliary: No focal liver abnormality.Subtle layering cholelithiasis. No evidence of biliary inflammation or obstruction. Pancreas: Unremarkable. Spleen: Unremarkable. Adrenals/Urinary Tract: Thickened appearance of the left adrenal gland without discrete mass. Two left renal lesions appear cystic density. The larger measures up to 27 mm. Left upper pole renal cortical scarring. No hydronephrosis or ureteral calculus. Unremarkable bladder. Stomach/Bowel: Thickened appearance of the colon centered on the splenic flexure, with submucosal low density edematous appearance. Subtle but convincing pericolonic edema at this level. No bowel obstruction. Suspect appendectomy. No notable diverticulosis. Vascular/Lymphatic: No acute vascular abnormality. Diffuse atherosclerotic calcification. No mass or adenopathy. Reproductive:Hysterectomy.  Negative adnexa Other: No ascites or pneumoperitoneum. Musculoskeletal: No acute abnormalities. Advanced lumbar disc and facet degeneration with grade 1 anterolisthesis at L4-5, there is also left foramina L5 compression IMPRESSION: 1. Colitis centered at the splenic flexure suggesting ischemic etiology. Infectious colitis could also have this appearance. 2. Cholelithiasis without cholecystitis. 3.  Aortic Atherosclerosis (ICD10-I70.0). Electronically Signed   By: Marnee Spring M.D.   On: 04/02/2017 14:57    2D ECHO:   Disposition and Follow-up: Discharge Instructions    Diet Carb Modified    Complete by:  As directed    Discharge instructions    Complete by:  As directed    Soft bland diet for 7-10 days. Please call GI doctor's office or return back to the ER if you have any further rectal  bleeding.  Please don't take aspirin or any ibuprofens for pain. You can take Tylenol as needed for any abdominal pain.  Please hold your BP medication for a week. Check your BP daily, if BP starts to run high in 140's-150's, you can restart it..   Increase activity slowly    Complete by:  As directed        DISPOSITION: *Home   DISCHARGE FOLLOW-UP Follow-up Information    Schedule an appointment as soon as possible for a visit with Renford Dills, MD.   Specialty:  Internal Medicine Contact information: [address] [place] [address] [phone]        Kathi Der, MD. Schedule an appointment as soon as possible for a visit in 1 week(s).   Specialty:  Gastroenterology Why:  office will call  you with appointment  Contact information: [address] [place] [address] [phone]            Time spent on Discharge:  Signed:   Thad Ranger M.D. [facility] 04/03/2017, 10:58 AM Pager: [phone]

## 2017-04-03 NOTE — Progress Notes (Signed)
  No charge note ------------------- - I have not seen this patient.  - Received call from primary team regarding arrangement for outpatient follow-up after discharge. - Patient was admitted yesterday with rectal bleeding. CT scan showed ischemic colitis. I was only informed about this patient a few minutes ago. Patient does not want to stay in the hospital. Based on primary attendings description, bleeding has resolved. Patient's hemodynamic is stable. - I will  make an arrangement for follow-up in the office in 1-2 weeks.    Kathi DerParag Golden Emile MD, FACP 04/03/2017, 10:09 AM  Pager [phone]  If no answer or after 5 PM call [phone]

## 2017-04-08 ENCOUNTER — Emergency Department (HOSPITAL_COMMUNITY)
Admission: EM | Admit: 2017-04-08 | Discharge: 2017-04-08 | Disposition: A | Discharge status: Home / Self Care | Payer: Medicare Other | Attending: Emergency Medicine | Admitting: Emergency Medicine

## 2017-04-08 ENCOUNTER — Encounter (HOSPITAL_COMMUNITY): Payer: Self-pay

## 2017-04-08 DIAGNOSIS — Z79899 Other long term (current) drug therapy: Secondary | ICD-10-CM | POA: Insufficient documentation

## 2017-04-08 DIAGNOSIS — N183 Chronic kidney disease, stage 3 (moderate): Secondary | ICD-10-CM | POA: Diagnosis not present

## 2017-04-08 DIAGNOSIS — E785 Hyperlipidemia, unspecified: Secondary | ICD-10-CM | POA: Insufficient documentation

## 2017-04-08 DIAGNOSIS — I129 Hypertensive chronic kidney disease with stage 1 through stage 4 chronic kidney disease, or unspecified chronic kidney disease: Secondary | ICD-10-CM | POA: Insufficient documentation

## 2017-04-08 DIAGNOSIS — E871 Hypo-osmolality and hyponatremia: Secondary | ICD-10-CM | POA: Diagnosis not present

## 2017-04-08 DIAGNOSIS — E119 Type 2 diabetes mellitus without complications: Secondary | ICD-10-CM | POA: Diagnosis not present

## 2017-04-08 DIAGNOSIS — K921 Melena: Secondary | ICD-10-CM | POA: Diagnosis present

## 2017-04-08 DIAGNOSIS — R195 Other fecal abnormalities: Secondary | ICD-10-CM

## 2017-04-08 LAB — COMPREHENSIVE METABOLIC PANEL
ALT: 25 U/L (ref 14–54)
AST: 38 U/L (ref 15–41)
Albumin: 3.8 g/dL (ref 3.5–5.0)
Alkaline Phosphatase: 75 U/L (ref 38–126)
Anion gap: 9 (ref 5–15)
BUN: 10 mg/dL (ref 6–20)
CO2: 20 mmol/L — ABNORMAL LOW (ref 22–32)
Calcium: 8.7 mg/dL — ABNORMAL LOW (ref 8.9–10.3)
Chloride: 95 mmol/L — ABNORMAL LOW (ref 101–111)
Creatinine, Ser: 1.19 mg/dL — ABNORMAL HIGH (ref 0.44–1.00)
GFR calc Af Amer: 45 mL/min — ABNORMAL LOW (ref >60)
GFR calc non Af Amer: 38 mL/min — ABNORMAL LOW (ref >60)
Glucose, Bld: 124 mg/dL — ABNORMAL HIGH (ref 65–99)
Potassium: 4 mmol/L (ref 3.5–5.1)
Sodium: 124 mmol/L — ABNORMAL LOW (ref 135–145)
Total Bilirubin: 0.5 mg/dL (ref 0.3–1.2)
Total Protein: 6.4 g/dL — ABNORMAL LOW (ref 6.5–8.1)

## 2017-04-08 LAB — TYPE AND SCREEN
ABO/RH(D): A POS
Antibody Screen: NEGATIVE

## 2017-04-08 LAB — CBC
HCT: 31.1 % — ABNORMAL LOW (ref 36.0–46.0)
Hemoglobin: 10.4 g/dL — ABNORMAL LOW (ref 12.0–15.0)
MCH: 29.5 pg (ref 26.0–34.0)
MCHC: 33.4 g/dL (ref 30.0–36.0)
MCV: 88.4 fL (ref 78.0–100.0)
Platelets: 248 10*3/uL (ref 150–400)
RBC: 3.52 MIL/uL — ABNORMAL LOW (ref 3.87–5.11)
RDW: 14 % (ref 11.5–15.5)
WBC: 7.9 10*3/uL (ref 4.0–10.5)

## 2017-04-08 LAB — ABO/RH: ABO/RH(D): A POS

## 2017-04-08 MED ORDER — QUINAPRIL HCL 10 MG PO TABS: 10.0000 mg | ORAL_TABLET | Freq: Every day | ORAL | 0 refills | Status: AC

## 2017-04-08 NOTE — ED Provider Notes (Signed)
[facility]-EMERGENCY DEPT Provider Note   CSN: [id number] Arrival date & time: 04/08/17  1347     History   Chief Complaint No chief complaint on file.   HPI Adrienne Ortiz is a 83 y.o. female.  HPI   89yF with dark stools. Recent admit with hematochezia. Attributed to colitis. Discharged 6/9. Stool initially tan after DC, then brown and now darker brown. This concerned her so she came to ED for evaluation. No gross blood. No dizziness or lightheadedness. Mild lower abdominal pain. Denies pain elsewhere. No blood thinners.   Past Medical History:  Diagnosis Date  . Anemia    OF CHRONIC DISEASE  . Arthritis   . Chronic renal insufficiency   . Diabetes mellitus without complication (HCC)   . Dysphagia   . Glaucoma   . Hyperlipidemia   . Hypertension   . Insomnia   . Osteopenia   . Peripheral neuropathy   . Reflux    HOARSENESS  . Restless leg syndrome   . Vitamin D deficiency     Patient Active Problem List   Diagnosis Date Noted  . Hypertension 04/02/2017  . Diabetes mellitus without complication (HCC) 04/02/2017  . Peripheral neuropathy 04/02/2017  . Dysphagia 04/02/2017  . Anemia 04/02/2017  . Hyperlipidemia 04/02/2017  . Rectal bleeding 04/02/2017  . Acute hypokalemia 04/02/2017  . Colitis 04/02/2017  . Acute renal failure superimposed on stage 3 chronic kidney disease (HCC) 04/02/2017  . Onychomycosis 03/09/2014  . Pain in lower limb 03/09/2014    Past Surgical History:  Procedure Laterality Date  . ABDOMINAL SURGERIES     2 PRIOR?  . BREAST LUMPECTOMY     RIGHT  . [facility]  [facility]  . REPAIR OF TORN LIGAMENT     RIGHT KNEE  . ROTATER CUFF SURGERY      OB History    No data available       Home Medications    Prior to Admission medications   Medication Sig Start Date End Date Taking? Authorizing Provider  ciprofloxacin (CIPRO) 500 MG tablet Take 1 tablet (500 mg total) by mouth 2 (two) times daily. X 10 DAYS 04/03/17  Yes Rai,  Ripudeep K, MD  acetaminophen (TYLENOL) 325 MG tablet Take 650 mg by mouth 2 (two) times daily as needed for moderate pain (arthiritis).     [provider]  brinzolamide (AZOPT) 1 % ophthalmic suspension Place 1 drop into the right eye 3 (three) times daily.    [provider]  cholecalciferol (VITAMIN D) 1000 UNITS tablet Take 1,000 Units by mouth daily.    [provider]  latanoprost (XALATAN) 0.005 % ophthalmic solution Place 1 drop into the right eye at bedtime.    [provider]  metroNIDAZOLE (FLAGYL) 500 MG tablet Take 1 tablet (500 mg total) by mouth 3 (three) times daily. X 10 DAYS 04/03/17   Rai, Delene Ruffiniipudeep K, MD  omeprazole (PRILOSEC) 40 MG capsule Take 40 mg by mouth daily.    [provider]  ondansetron (ZOFRAN ODT) 4 MG disintegrating tablet Take 1 tablet (4 mg total) by mouth every 8 (eight) hours as needed for nausea or vomiting. 04/03/17   Rai, Delene Ruffiniipudeep K, MD  Polyethyl Glycol-Propyl Glycol (SYSTANE) 0.4-0.3 % SOLN Apply to eye as needed.    [provider]  quinapril-hydrochlorothiazide (ACCURETIC) 10-12.5 MG tablet Take 1 tablet by mouth daily. HOLD for a week. 04/03/17   Rai, Delene Ruffiniipudeep K, MD  simvastatin (ZOCOR) 20 MG  tablet Take 20 mg by mouth at bedtime.     [provider]  timolol (BETIMOL) 0.5 % ophthalmic solution Place 1 drop into the right eye 2 (two) times daily.    [provider]    Family History Family History  Problem Relation Age of Onset  . Cancer Mother   . Heart disease Father   . Hypertension Father   . Lung cancer Sister   . Throat cancer Brother   . Throat cancer Brother     Social History Social History  Substance Use Topics  . Smoking status: Former Games developer  . Smokeless tobacco: Never Used  . Alcohol use Not on file     Allergies   Celebrex [celecoxib]; Oxaprozin; Penicillins; and Aspirin   Review of Systems Review of Systems  All systems reviewed and negative, other  than as noted in HPI.  Physical Exam Updated Vital Signs BP 129/71   Pulse 71   Temp 98.1 F (36.7 C) (Oral)   Resp 18   Ht 5\' 6"  (1.676 m)   Wt 77.1 kg (170 lb)   SpO2 100%   BMI 27.44 kg/m   Physical Exam  Constitutional: She is oriented to person, place, and time. She appears well-developed and well-nourished. No distress.  HENT:  Head: Normocephalic and atraumatic.  Eyes: Conjunctivae and EOM are normal. Pupils are equal, round, and reactive to light. Right eye exhibits no discharge. Left eye exhibits no discharge.  Neck: Neck supple.  Cardiovascular: Normal rate, regular rhythm and normal heart sounds.  Exam reveals no gallop and no friction rub.   No murmur heard. Pulmonary/Chest: Effort normal and breath sounds normal. No respiratory distress.  Abdominal: Soft. She exhibits no distension. There is no tenderness.  Musculoskeletal: She exhibits no edema or tenderness.  Neurological: She is alert and oriented to person, place, and time. No cranial nerve deficit or sensory deficit. She exhibits normal muscle tone. Coordination normal.  Skin: Skin is warm and dry.  Psychiatric: She has a normal mood and affect. Her behavior is normal. Thought content normal.  Nursing note and vitals reviewed.    ED Treatments / Results  Labs (all labs ordered are listed, but only abnormal results are displayed) Labs Reviewed  COMPREHENSIVE METABOLIC PANEL - Abnormal; Notable for the following:       Result Value   Sodium 124 (*)    Chloride 95 (*)    CO2 20 (*)    Glucose, Bld 124 (*)    Creatinine, Ser 1.19 (*)    Calcium 8.7 (*)    Total Protein 6.4 (*)    GFR calc non Af Amer 38 (*)    GFR calc Af Amer 45 (*)    All other components within normal limits  CBC - Abnormal; Notable for the following:    RBC 3.52 (*)    Hemoglobin 10.4 (*)    HCT 31.1 (*)    All other components within normal limits  POC OCCULT BLOOD, ED  TYPE AND SCREEN  ABO/RH    EKG  EKG  Interpretation None       Radiology No results found.  Procedures Procedures (including critical care time)  Medications Ordered in ED Medications - No data to display   Initial Impression / Assessment and Plan / ED Course  I have reviewed the triage vital signs and the nursing notes.  Pertinent labs & imaging results that were available during my care of the patient were reviewed by me and  considered in my medical decision making (see chart for details).     89yF with dark stools. Recent admit for hematochezia. Denies further gross blood. H/H improved from most recent. HD stable. Noted to be significantly hyponatremic which is acute. This is dilutional. Pt says she was advised to drink a lot of water when she was discharged because she was told she was dehydrated. She estimates she has been drinking 18-20 8oz cups per day since then. She was advised to stop her quinapril-hctz for a week but has taken a couple doses of if because BP has been up to 180s systolic.   Contemplated admission given the degree of her hyponatremia but she denies any symptoms that I can attribute to it. She is alert and very appropriate. Denies HA, confusion, n/v, etc. This has likely developed over the days since discharge and I expect it to correct over the next several days with restriction.   I advised her to not drink any more water today. Subsequently limit water to 64oz/day. Advised her to stop accuretic for the next week. BP in ED is fine. She is hesitent to do this despite reassurance. Compromised and will give her a week of quinapril so she isn't taking the hctz.  She has appointment with GI scheduled. Even if stool is heme positive it's not going to change my management at this point. She is on PPI. Not anticoagulated. HD stable. H/H stable-to-improved.   It has been determined that no acute conditions requiring further emergency intervention are present at this time. The patient has been advised of  the diagnosis and plan. I reviewed any labs and imaging including any potential incidental findings. We have discussed signs and symptoms that warrant return to the ED and they are listed in the discharge instructions.    Final Clinical Impressions(s) / ED Diagnoses   Final diagnoses:  Dilutional hyponatremia  Dark stools    New Prescriptions New Prescriptions   No medications on file     Raeford Razor, MD 04/08/17 1644

## 2017-04-08 NOTE — ED Triage Notes (Signed)
Patient complains of 2 days of dark stools. Was admitted last weekend for possible GI bleed and concerned that she has the dark stools now. No abdominal pain, no vomiting. Alert and oriented, NAD

## 2017-04-08 NOTE — Discharge Instructions (Signed)
Do not drink any additional water for the rest of the day.  I commend you for drinking more water, but the 140-160 oz you have been drinking is too much. Limit yourself to 64oz of water per day. Do not take your regular blood pressure medication for the next week. Take the quinapril only for the next 7 days instead and then restart your normal medication after then.  You need to have repeat blood work in a week. Return to the ER if you begin getting headaches, confusion, increasing abdominal pain or nausea.

## 2017-12-29 ENCOUNTER — Encounter: Payer: Self-pay | Admitting: Podiatry

## 2017-12-29 ENCOUNTER — Ambulatory Visit: Payer: Medicare Other | Admitting: Podiatry

## 2017-12-29 VITALS — BP 168/85 | HR 66

## 2017-12-29 DIAGNOSIS — M129 Arthropathy, unspecified: Secondary | ICD-10-CM

## 2017-12-29 DIAGNOSIS — M2011 Hallux valgus (acquired), right foot: Secondary | ICD-10-CM

## 2017-12-29 DIAGNOSIS — M79674 Pain in right toe(s): Secondary | ICD-10-CM

## 2017-12-29 DIAGNOSIS — B351 Tinea unguium: Secondary | ICD-10-CM | POA: Diagnosis not present

## 2017-12-29 DIAGNOSIS — M2012 Hallux valgus (acquired), left foot: Secondary | ICD-10-CM | POA: Diagnosis not present

## 2017-12-29 DIAGNOSIS — M79675 Pain in left toe(s): Secondary | ICD-10-CM

## 2017-12-29 DIAGNOSIS — E1142 Type 2 diabetes mellitus with diabetic polyneuropathy: Secondary | ICD-10-CM | POA: Diagnosis not present

## 2017-12-29 NOTE — Progress Notes (Addendum)
   Subjective:    Patient ID: Adrienne Ortiz, female    DOB: [birth year], 84 y.o.   MRN: [id number]  HPIthis patient presents the office today with chief compla of long thick painful nails.  Patient states the nails have become thick and long.  She says they are painful walking and wearing her shoes.  She says she was doing her own nails, but  now desires to have it professionally performed. She also says that she is without any complication.  She also requests a diabetic foot exam for possible diabetic footwear.    Review of Systems  All other systems reviewed and are negative.      Objective:   Physical Exam General Appearance  Alert, conversant and in no acute stress.  Vascular  Dorsalis pedis and posterior tibial  pulses are not  palpable  bilaterally.  Capillary return is within normal limits  bilaterally. Temperature is within normal limits  bilaterally.  Neurologic  Senn-Weinstein monofilament wire test absent   bilaterally. Muscle power within normal limits bilaterally.  Nails Thick disfigured discolored nails with subungual debris  from hallux to fifth toes bilaterally. No evidence of bacterial infection or drainage bilaterally.  Orthopedic  No limitations of motion of motion feet .  No crepitus or effusions noted.  HAV  B/L.  Arthritic midfoot broken down with prominent bone noted medially.  Skin  normotropic skin with no porokeratosis noted bilaterally.  No signs of infections or ulcers noted.          Assessment & Plan:  Onychomycosis  B/L  Diabetes with angiopathy and neuropathy.  Midfoot Arthritis  IE  Debride nails  X 10.  Examination of her diabetic feet reveal absent pulses and absent LOPS.  Patient does qualify for diabetic footgear due to DPN and arthritis  B/L, and HAV  B/L. [facility]  RTC for preventative foot care services 3 months.   Helane GuntherGregory Mayer DPM

## 2018-01-25 ENCOUNTER — Other Ambulatory Visit: Payer: Medicare Other

## 2018-02-15 ENCOUNTER — Ambulatory Visit: Payer: Medicare Other | Admitting: Orthotics

## 2018-02-15 ENCOUNTER — Ambulatory Visit (INDEPENDENT_AMBULATORY_CARE_PROVIDER_SITE_OTHER): Payer: Medicare Other | Admitting: Podiatry

## 2018-02-15 DIAGNOSIS — M2011 Hallux valgus (acquired), right foot: Secondary | ICD-10-CM

## 2018-02-15 DIAGNOSIS — M129 Arthropathy, unspecified: Secondary | ICD-10-CM

## 2018-02-15 DIAGNOSIS — M2012 Hallux valgus (acquired), left foot: Secondary | ICD-10-CM

## 2018-02-15 DIAGNOSIS — E1142 Type 2 diabetes mellitus with diabetic polyneuropathy: Secondary | ICD-10-CM

## 2018-02-16 ENCOUNTER — Encounter: Payer: Self-pay | Admitting: Podiatry

## 2018-02-16 NOTE — Progress Notes (Signed)
This patient presents to pick up her new shoes.    Diabetic neuropathy.  Unilateral pes planus  Left foot.  Patient received her shoes but needs braces for her rearfoot left.  RTC  Betha   Helane GuntherGregory Sheilah Rayos DPM

## 2018-02-18 NOTE — Progress Notes (Signed)
Patient had appointment today for definitive and final diabetic shoe fitting and delivery.  Patient was seen by Betha, C.Ped, [facility].   The inserts fit well and accomplished full contact with the plantar surface of the foot bilateral; the shoes fit well and offered forefoot freedom, no noticible heel slippage, and good toe clearance w/ the insert in place.  Patient was advised to monitor of any skin irritation, breakdown.  Patient was satisfied with fit and function. 

## 2018-03-03 ENCOUNTER — Ambulatory Visit
Admission: RE | Admit: 2018-03-03 | Discharge: 2018-03-03 | Disposition: A | Discharge status: Home / Self Care | Payer: Medicare Other | Source: Ambulatory Visit | Attending: Internal Medicine | Admitting: Internal Medicine

## 2018-03-03 ENCOUNTER — Other Ambulatory Visit: Payer: Self-pay | Admitting: Internal Medicine

## 2018-03-03 DIAGNOSIS — M545 Low back pain: Secondary | ICD-10-CM

## 2018-03-08 ENCOUNTER — Other Ambulatory Visit: Payer: Medicare Other

## 2018-03-29 ENCOUNTER — Encounter: Payer: Self-pay | Admitting: Sports Medicine

## 2018-03-29 ENCOUNTER — Ambulatory Visit: Payer: Medicare Other | Admitting: Sports Medicine

## 2018-03-29 DIAGNOSIS — M79674 Pain in right toe(s): Secondary | ICD-10-CM

## 2018-03-29 DIAGNOSIS — M2011 Hallux valgus (acquired), right foot: Secondary | ICD-10-CM

## 2018-03-29 DIAGNOSIS — M2042 Other hammer toe(s) (acquired), left foot: Secondary | ICD-10-CM

## 2018-03-29 DIAGNOSIS — M2041 Other hammer toe(s) (acquired), right foot: Secondary | ICD-10-CM

## 2018-03-29 DIAGNOSIS — M2012 Hallux valgus (acquired), left foot: Secondary | ICD-10-CM

## 2018-03-29 DIAGNOSIS — M79675 Pain in left toe(s): Secondary | ICD-10-CM

## 2018-03-29 DIAGNOSIS — M129 Arthropathy, unspecified: Secondary | ICD-10-CM

## 2018-03-29 DIAGNOSIS — E1142 Type 2 diabetes mellitus with diabetic polyneuropathy: Secondary | ICD-10-CM

## 2018-03-29 DIAGNOSIS — B351 Tinea unguium: Secondary | ICD-10-CM

## 2018-03-29 NOTE — Progress Notes (Signed)
Subjective: Adrienne Ortiz is a 84 y.o. female patient with history of diabetes who presents to office today complaining of long,mildly painful nails  while ambulating in shoes; unable to trim. Patient states that the glucose reading this morning was NOT CHECKED; NOT ON ANY MEDS FOR DIABETES. Admits that she was unhappy with last visit and wanted someone else to trim her nails.  Patient Active Problem List   Diagnosis Date Noted  . Hypertension 04/02/2017  . Diabetes mellitus without complication (HCC) 04/02/2017  . Peripheral neuropathy 04/02/2017  . Dysphagia 04/02/2017  . Anemia 04/02/2017  . Hyperlipidemia 04/02/2017  . Rectal bleeding 04/02/2017  . Acute hypokalemia 04/02/2017  . Colitis 04/02/2017  . Acute renal failure superimposed on stage 3 chronic kidney disease (HCC) 04/02/2017  . Onychomycosis 03/09/2014  . Pain in lower limb 03/09/2014   Current Outpatient Medications on File Prior to Visit  Medication Sig Dispense Refill  . acetaminophen (TYLENOL) 325 MG tablet Take 650 mg by mouth 2 (two) times daily as needed for moderate pain (arthiritis).     . brinzolamide (AZOPT) 1 % ophthalmic suspension Place 1 drop into the right eye 3 (three) times daily.    . cholecalciferol (VITAMIN D) 1000 UNITS tablet Take 1,000 Units by mouth daily.    . ciprofloxacin (CIPRO) 500 MG tablet Take 1 tablet (500 mg total) by mouth 2 (two) times daily. X 10 DAYS 20 tablet 0  . latanoprost (XALATAN) 0.005 % ophthalmic solution Place 1 drop into the right eye at bedtime.    . metroNIDAZOLE (FLAGYL) 500 MG tablet Take 1 tablet (500 mg total) by mouth 3 (three) times daily. X 10 DAYS 30 tablet 0  . omeprazole (PRILOSEC) 40 MG capsule Take 40 mg by mouth daily.    . ondansetron (ZOFRAN ODT) 4 MG disintegrating tablet Take 1 tablet (4 mg total) by mouth every 8 (eight) hours as needed for nausea or vomiting. 20 tablet 0  . Polyethyl Glycol-Propyl Glycol (SYSTANE) 0.4-0.3 % SOLN Apply to eye as needed.     . quinapril (ACCUPRIL) 10 MG tablet Take 1 tablet (10 mg total) by mouth daily. 7 tablet 0  . quinapril-hydrochlorothiazide (ACCURETIC) 10-12.5 MG tablet Take 1 tablet by mouth daily. HOLD for a week.    . simvastatin (ZOCOR) 20 MG tablet Take 20 mg by mouth at bedtime.     . timolol (BETIMOL) 0.5 % ophthalmic solution Place 1 drop into the right eye 2 (two) times daily.     No current facility-administered medications on file prior to visit.    Allergies  Allergen Reactions  . Celebrex [Celecoxib] Swelling  . Oxaprozin Swelling  . Penicillins Itching    Has patient had a PCN reaction causing immediate rash, facial/tongue/throat swelling, SOB or lightheadedness with hypotension: No Has patient had a PCN reaction causing severe rash involving mucus membranes or skin necrosis: No Has patient had a PCN reaction that required hospitalization: No Has patient had a PCN reaction occurring within the last 10 years: No If all of the above answers are "NO", then may proceed with Cephalosporin use.  . Aspirin Other (See Comments)    Uncoated clear liquid come up in her mouth if taken on an empty stomach    No results found for this or any previous visit (from the past 2160 hour(s)).  Objective: General: Patient is awake, alert, and oriented x 3 and in no acute distress.  Integument: Skin is warm, dry and supple bilateral. Nails are tender, long,  thickened and  dystrophic with subungual debris, consistent with onychomycosis, 1-5 bilateral. No signs of infection. No open lesions or preulcerative lesions present bilateral. Remaining integument unremarkable.  Vasculature:  Dorsalis Pedis pulse 1/4 bilateral. Posterior Tibial pulse 1 /4 bilateral.  Capillary fill time <3 sec 1-5 bilateral. Scant hair growth to the level of the digits. Temperature gradient within normal limits. No varicosities present bilateral. Trace edema present bilateral.   Neurology: The patient has diminished sensation  measured with a 5.07/10g Semmes Weinstein Monofilament at all pedal sites bilateral . Vibratory sensation diminished bilateral with tuning fork. No Babinski sign present bilateral.   Musculoskeletal: Asymptomatic bunion, hammertoe, and pes planus pedal deformities noted bilateral. Muscular strength 4/5 in all lower extremity muscular groups bilateral without pain on range of motion . No tenderness with calf compression bilateral.  Assessment and Plan: Problem List Items Addressed This Visit    None    Visit Diagnoses    Pain due to onychomycosis of toenails of both feet    -  Primary   Diabetic polyneuropathy associated with type 2 diabetes mellitus (HCC)       Chronic arthropathy       Acquired hallux valgus of right foot       Acquired hallux valgus of left foot       Hammer toes of both feet          -Examined patient. -Discussed and educated patient on diabetic foot care, especially with  regards to the vascular, neurological and musculoskeletal systems.  -Stressed the importance of good glycemic control and the detriment of not  controlling glucose levels in relation to the foot. -Mechanically debrided all nails 1-5 bilateral using sterile nail nipper and filed with dremel without incident  -Continue with good supportive shoes with plastizote insoles  -Answered all patient questions -Patient to return  in 3 months for at risk foot care -Patient advised to call the office if any problems or questions arise in the meantime.  Asencion Islam, DPM

## 2018-03-30 ENCOUNTER — Ambulatory Visit: Payer: Medicare Other | Admitting: Podiatry

## 2018-07-05 ENCOUNTER — Encounter: Payer: Self-pay | Admitting: Sports Medicine

## 2018-07-05 ENCOUNTER — Ambulatory Visit: Payer: Medicare Other | Admitting: Sports Medicine

## 2018-07-05 DIAGNOSIS — M79675 Pain in left toe(s): Secondary | ICD-10-CM

## 2018-07-05 DIAGNOSIS — M79674 Pain in right toe(s): Secondary | ICD-10-CM | POA: Diagnosis not present

## 2018-07-05 DIAGNOSIS — E1142 Type 2 diabetes mellitus with diabetic polyneuropathy: Secondary | ICD-10-CM | POA: Diagnosis not present

## 2018-07-05 DIAGNOSIS — M2142 Flat foot [pes planus] (acquired), left foot: Secondary | ICD-10-CM

## 2018-07-05 DIAGNOSIS — M129 Arthropathy, unspecified: Secondary | ICD-10-CM

## 2018-07-05 DIAGNOSIS — M2141 Flat foot [pes planus] (acquired), right foot: Secondary | ICD-10-CM

## 2018-07-05 DIAGNOSIS — B351 Tinea unguium: Secondary | ICD-10-CM | POA: Diagnosis not present

## 2018-07-05 NOTE — Progress Notes (Signed)
Subjective: Adrienne Ortiz is a 84 y.o. female patient with history of diabetes who presents to office today complaining of long,mildly painful nails  while ambulating in shoes; unable to trim. Patient states that the glucose reading this morning was NOT CHECKED; NOT ON ANY MEDS FOR DIABETES. Admits that she had a blister at left medial arch that now is better but still has some pain when she is in shoes. Reports that she has been using her shoes and insoles but thinks that her foot and ankle still turns inwards and wants to see if she can get a brace.   Patient Active Problem List   Diagnosis Date Noted  . Hypertension 04/02/2017  . Diabetes mellitus without complication (HCC) 04/02/2017  . Peripheral neuropathy 04/02/2017  . Dysphagia 04/02/2017  . Anemia 04/02/2017  . Hyperlipidemia 04/02/2017  . Rectal bleeding 04/02/2017  . Acute hypokalemia 04/02/2017  . Colitis 04/02/2017  . Acute renal failure superimposed on stage 3 chronic kidney disease (HCC) 04/02/2017  . Onychomycosis 03/09/2014  . Pain in lower limb 03/09/2014   Current Outpatient Medications on File Prior to Visit  Medication Sig Dispense Refill  . acetaminophen (TYLENOL) 325 MG tablet Take 650 mg by mouth 2 (two) times daily as needed for moderate pain (arthiritis).     . brinzolamide (AZOPT) 1 % ophthalmic suspension Place 1 drop into the right eye 3 (three) times daily.    . cholecalciferol (VITAMIN D) 1000 UNITS tablet Take 1,000 Units by mouth daily.    . ciprofloxacin (CIPRO) 500 MG tablet Take 1 tablet (500 mg total) by mouth 2 (two) times daily. X 10 DAYS 20 tablet 0  . latanoprost (XALATAN) 0.005 % ophthalmic solution Place 1 drop into the right eye at bedtime.    . metroNIDAZOLE (FLAGYL) 500 MG tablet Take 1 tablet (500 mg total) by mouth 3 (three) times daily. X 10 DAYS 30 tablet 0  . omeprazole (PRILOSEC) 40 MG capsule Take 40 mg by mouth daily.    . ondansetron (ZOFRAN ODT) 4 MG disintegrating tablet Take  1 tablet (4 mg total) by mouth every 8 (eight) hours as needed for nausea or vomiting. 20 tablet 0  . Polyethyl Glycol-Propyl Glycol (SYSTANE) 0.4-0.3 % SOLN Apply to eye as needed.    . quinapril (ACCUPRIL) 10 MG tablet Take 1 tablet (10 mg total) by mouth daily. 7 tablet 0  . quinapril-hydrochlorothiazide (ACCURETIC) 10-12.5 MG tablet Take 1 tablet by mouth daily. HOLD for a week.    . simvastatin (ZOCOR) 20 MG tablet Take 20 mg by mouth at bedtime.     . timolol (BETIMOL) 0.5 % ophthalmic solution Place 1 drop into the right eye 2 (two) times daily.     No current facility-administered medications on file prior to visit.    Allergies  Allergen Reactions  . Celebrex [Celecoxib] Swelling  . Oxaprozin Swelling  . Penicillins Itching    Has patient had a PCN reaction causing immediate rash, facial/tongue/throat swelling, SOB or lightheadedness with hypotension: No Has patient had a PCN reaction causing severe rash involving mucus membranes or skin necrosis: No Has patient had a PCN reaction that required hospitalization: No Has patient had a PCN reaction occurring within the last 10 years: No If all of the above answers are "NO", then may proceed with Cephalosporin use.  . Aspirin Other (See Comments)    Uncoated clear liquid come up in her mouth if taken on an empty stomach    No results found for  this or any previous visit (from the past 2160 hour(s)).  Objective: General: Patient is awake, alert, and oriented x 3 and in no acute distress.  Integument: Skin is warm, dry and supple bilateral. Nails are tender, long, thickened and dystrophic with subungual debris, consistent with onychomycosis, 1-5 bilateral. No signs of infection. No open lesions or preulcerative lesions present bilateral. Resolved blister at medial arch on left. Remaining integument unremarkable.  Vasculature:  Dorsalis Pedis pulse 1/4 bilateral. Posterior Tibial pulse 1 /4 bilateral.  Capillary fill time <3 sec 1-5  bilateral. Scant hair growth to the level of the digits. Temperature gradient within normal limits. No varicosities present bilateral. Trace edema present bilateral.   Neurology: The patient has diminished sensation measured with a 5.07/10g Semmes Weinstein Monofilament at all pedal sites bilateral . Vibratory sensation diminished bilateral with tuning fork. No Babinski sign present bilateral.   Musculoskeletal: Asymptomatic bunion, hammertoe, and pes planus pedal deformities noted bilateral with left end stage and rigid. Muscular strength 4/5 in all lower extremity muscular groups bilateral without pain on range of motion . No tenderness with calf compression bilateral.  Assessment and Plan: Problem List Items Addressed This Visit    None    Visit Diagnoses    Pain due to onychomycosis of toenails of both feet    -  Primary   Diabetic polyneuropathy associated with type 2 diabetes mellitus (HCC)       Chronic arthropathy       Pes planus of both feet       L>R      -Examined patient. -Discussed and educated patient on diabetic foot care, especially with  regards to the vascular, neurological and musculoskeletal systems.  -Stressed the importance of good glycemic control and the detriment of not  controlling glucose levels in relation to the foot. -Mechanically debrided all nails 1-5 bilateral using sterile nail nipper and filed with dremel without incident  -Continue with good supportive shoes with plastizote insoles and padding; Patient was seen by Fenton Foy and discussed brace and was casted this visit -Answered all patient questions -Patient to return  in 3 months for at risk foot care and when called for brace pick up -Patient advised to call the office if any problems or questions arise in the meantime.  Asencion Islam, DPM

## 2018-07-30 IMAGING — CT CT ABD-PELV W/O CM
2 of 4 series · 16 of 46 positions shown, 18 images · non-contrast
Comparison: None.

CLINICAL DATA: Rectal bleeding for 2 days with nausea and vomiting

EXAM:
CT ABDOMEN AND PELVIS WITHOUT CONTRAST
TECHNIQUE: Multidetector CT imaging of the abdomen and pelvis was performed
following the standard protocol without IV contrast.

[Series 3: a/p w/o 5mm · axial · non-contrast · 0.69mm/px · z∈[+764,+1184]mm · 13 of 92 slices shown, 15 images]
[im 4/92  soft-tissue]
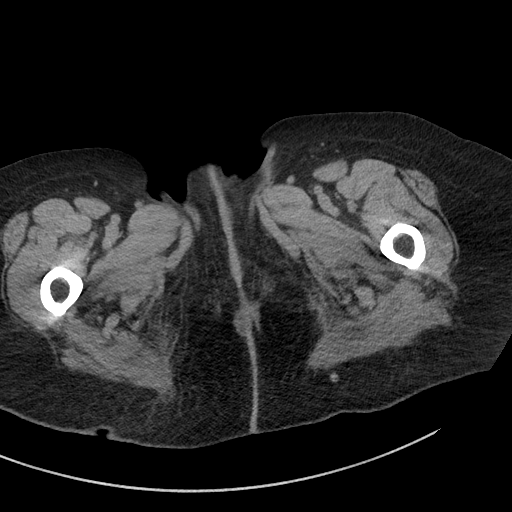
[im 4/92  bone]
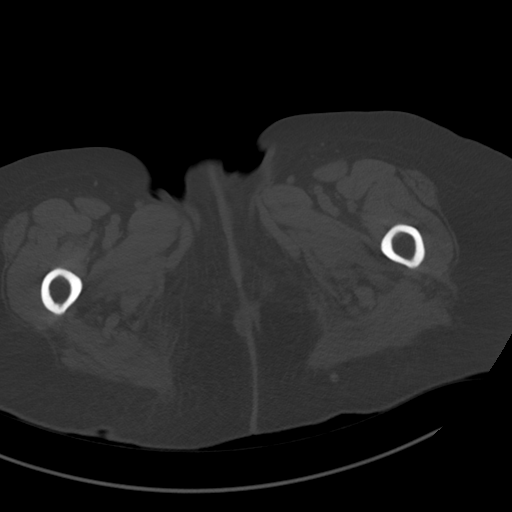
[im 12/92  soft-tissue]
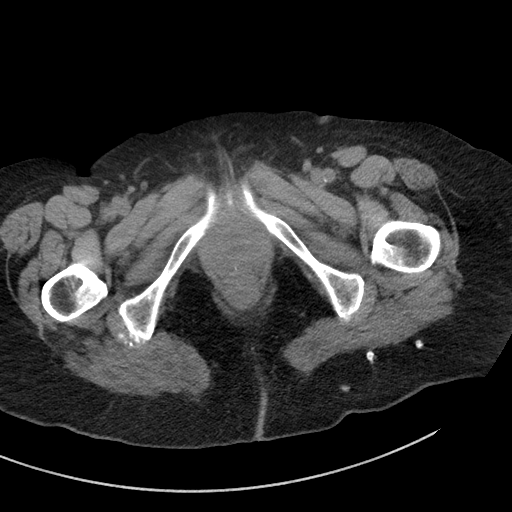
[im 19/92  soft-tissue]
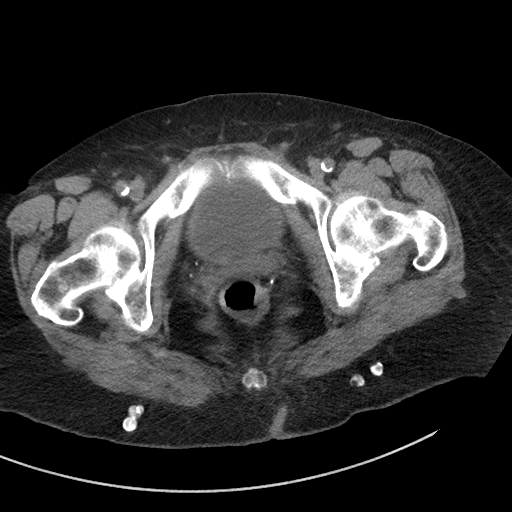
[im 27/92  soft-tissue]
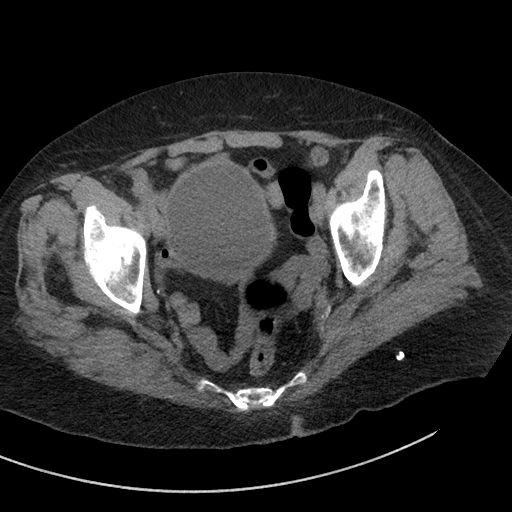
[im 31/92  soft-tissue]
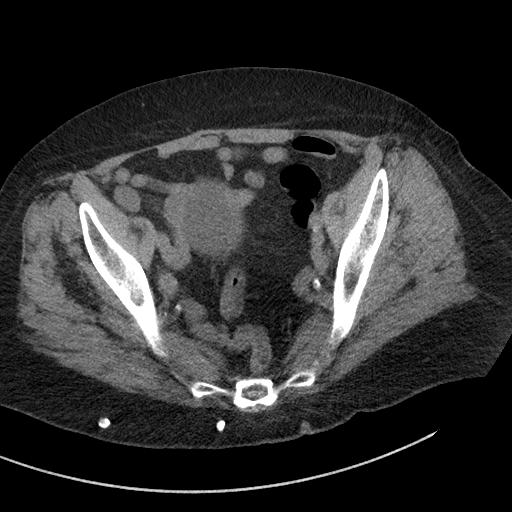
[im 38/92  soft-tissue]
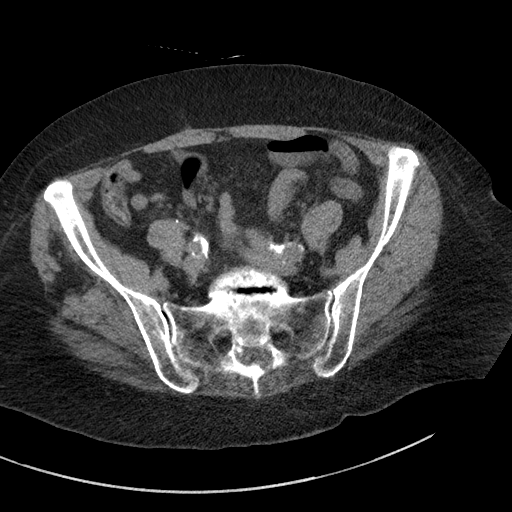
[im 46/92  soft-tissue]
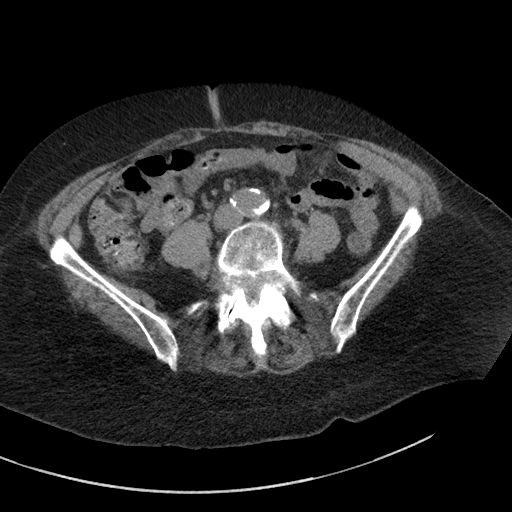
[im 54/92  soft-tissue]
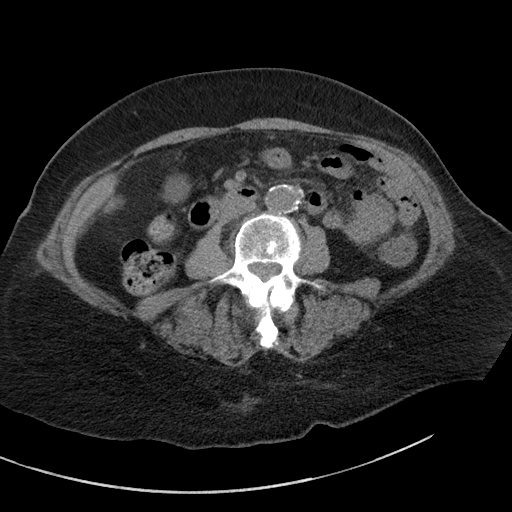
[im 61/92  soft-tissue]
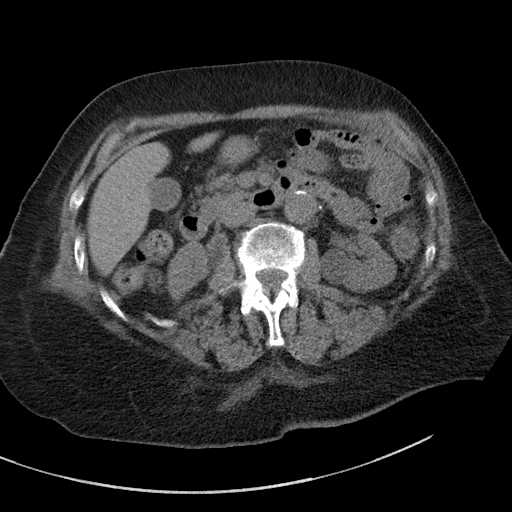
[im 61/92  bone]
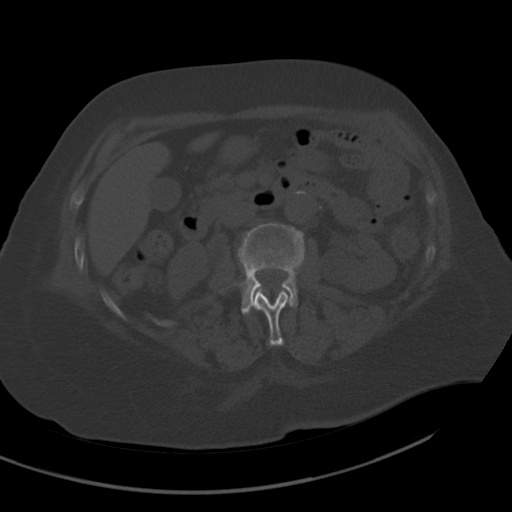
[im 65/92  soft-tissue]
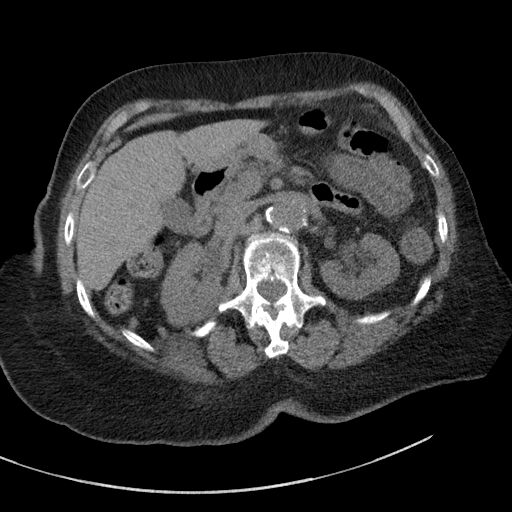
[im 73/92  soft-tissue]
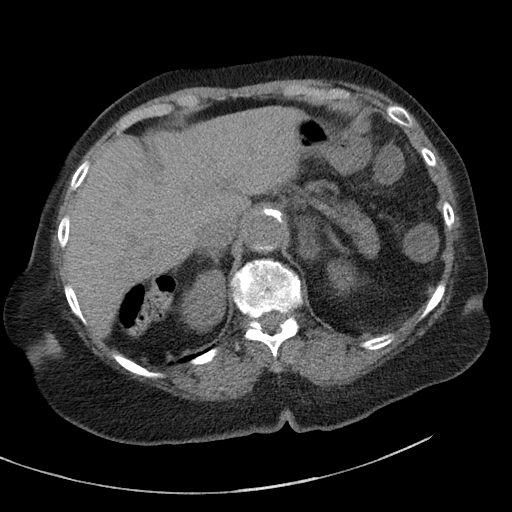
[im 80/92  soft-tissue]
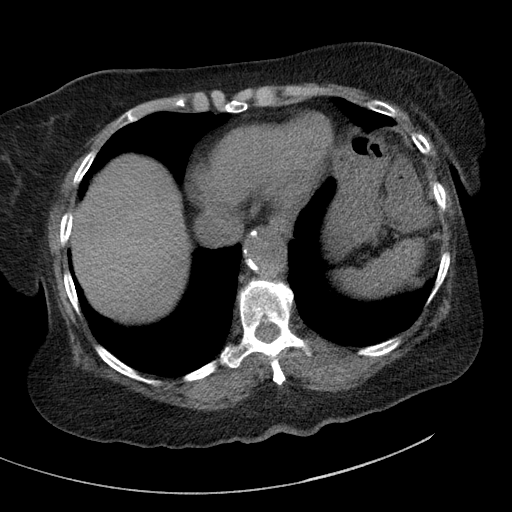
[im 88/92  soft-tissue]
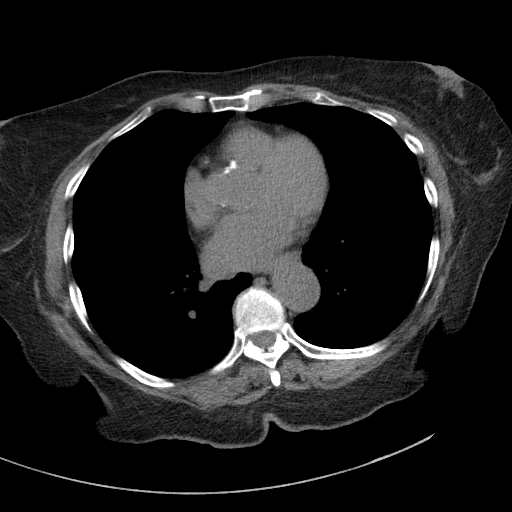

[Series 6: a/p w/o cor · coronal · non-contrast · 0.80mm/px · 3 of 126 slices shown]
[im 42/126  soft-tissue]
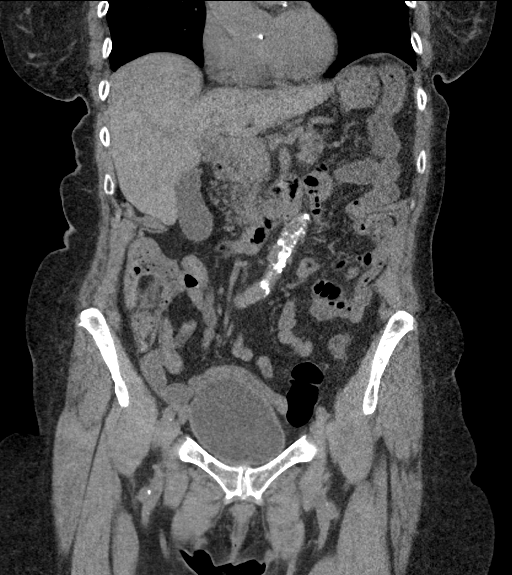
[im 56/126  soft-tissue]
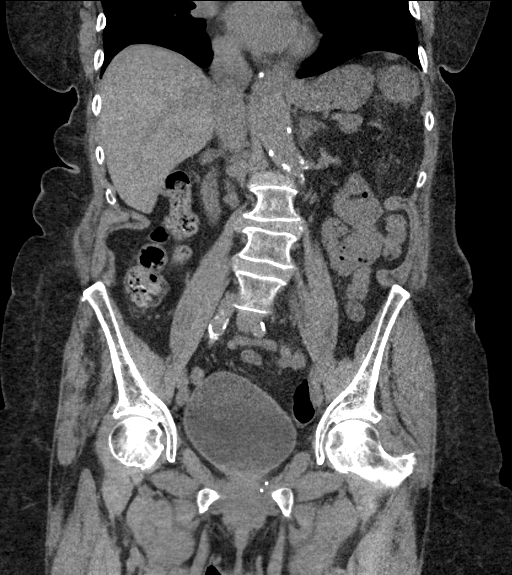
[im 70/126  soft-tissue]
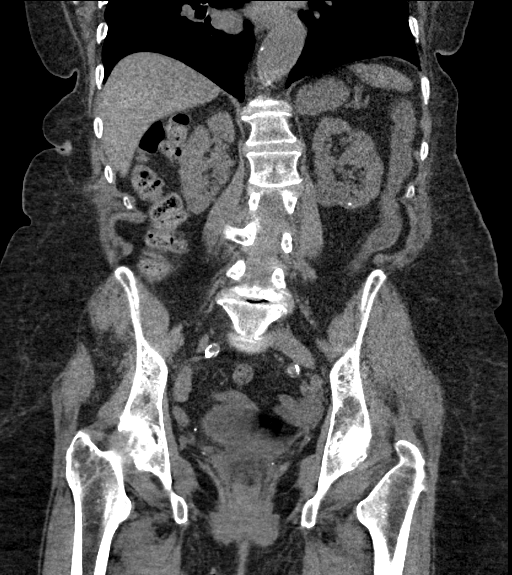

[16 of 46 positions shown; findings below may reference images not displayed]

FINDINGS: Lower chest:  No acute finding

Hepatobiliary: No focal liver abnormality.Subtle layering
cholelithiasis. No evidence of biliary inflammation or obstruction.

Pancreas: Unremarkable.

Spleen: Unremarkable.

Adrenals/Urinary Tract: Thickened appearance of the left adrenal
gland without discrete mass. Two left renal lesions appear cystic
density. The larger measures up to 27 mm. Left upper pole renal
cortical scarring. No hydronephrosis or ureteral calculus.
Unremarkable bladder.

Stomach/Bowel: Thickened appearance of the colon centered on the
splenic flexure, with submucosal low density edematous appearance.
Subtle but convincing pericolonic edema at this level. No bowel
obstruction. Suspect appendectomy. No notable diverticulosis.

Vascular/Lymphatic: No acute vascular abnormality. Diffuse
atherosclerotic calcification. No mass or adenopathy.

Reproductive:Hysterectomy.  Negative adnexa

Other: No ascites or pneumoperitoneum.

Musculoskeletal: No acute abnormalities. Advanced lumbar disc and
facet degeneration with grade 1 anterolisthesis at L4-5, there is
also left foramina L5 compression
IMPRESSION: 1. Colitis centered at the splenic flexure suggesting ischemic
etiology. Infectious colitis could also have this appearance.
2. Cholelithiasis without cholecystitis.
3.  Aortic Atherosclerosis (W98F4-DL2.2).

## 2018-08-17 ENCOUNTER — Ambulatory Visit: Payer: Medicare Other | Admitting: Orthotics

## 2018-08-17 DIAGNOSIS — M2142 Flat foot [pes planus] (acquired), left foot: Secondary | ICD-10-CM | POA: Diagnosis not present

## 2018-08-17 DIAGNOSIS — M2012 Hallux valgus (acquired), left foot: Secondary | ICD-10-CM

## 2018-08-17 DIAGNOSIS — M2041 Other hammer toe(s) (acquired), right foot: Secondary | ICD-10-CM

## 2018-08-17 DIAGNOSIS — E1142 Type 2 diabetes mellitus with diabetic polyneuropathy: Secondary | ICD-10-CM

## 2018-08-17 DIAGNOSIS — M2042 Other hammer toe(s) (acquired), left foot: Secondary | ICD-10-CM

## 2018-08-17 DIAGNOSIS — M129 Arthropathy, unspecified: Secondary | ICD-10-CM | POA: Diagnosis not present

## 2018-08-17 DIAGNOSIS — M2141 Flat foot [pes planus] (acquired), right foot: Secondary | ICD-10-CM

## 2018-08-29 ENCOUNTER — Other Ambulatory Visit: Payer: Self-pay

## 2018-08-29 ENCOUNTER — Emergency Department (HOSPITAL_COMMUNITY): Payer: Medicare Other | Admitting: Anesthesiology

## 2018-08-29 ENCOUNTER — Inpatient Hospital Stay (HOSPITAL_COMMUNITY)
Admission: EM | Admit: 2018-08-29 | Discharge: 2018-09-25 | DRG: 501 | Disposition: E | Payer: Medicare Other | Attending: Vascular Surgery | Admitting: Vascular Surgery

## 2018-08-29 ENCOUNTER — Encounter (HOSPITAL_COMMUNITY): Payer: Self-pay | Admitting: Emergency Medicine

## 2018-08-29 ENCOUNTER — Emergency Department (HOSPITAL_COMMUNITY): Payer: Medicare Other

## 2018-08-29 ENCOUNTER — Encounter (HOSPITAL_COMMUNITY): Admission: EM | Disposition: E | Payer: Self-pay | Source: Home / Self Care | Attending: Vascular Surgery

## 2018-08-29 DIAGNOSIS — M1990 Unspecified osteoarthritis, unspecified site: Secondary | ICD-10-CM | POA: Diagnosis present

## 2018-08-29 DIAGNOSIS — M8580 Other specified disorders of bone density and structure, unspecified site: Secondary | ICD-10-CM | POA: Diagnosis present

## 2018-08-29 DIAGNOSIS — E785 Hyperlipidemia, unspecified: Secondary | ICD-10-CM | POA: Diagnosis present

## 2018-08-29 DIAGNOSIS — N179 Acute kidney failure, unspecified: Secondary | ICD-10-CM | POA: Diagnosis present

## 2018-08-29 DIAGNOSIS — Z801 Family history of malignant neoplasm of trachea, bronchus and lung: Secondary | ICD-10-CM | POA: Diagnosis not present

## 2018-08-29 DIAGNOSIS — G2581 Restless legs syndrome: Secondary | ICD-10-CM | POA: Diagnosis present

## 2018-08-29 DIAGNOSIS — Z888 Allergy status to other drugs, medicaments and biological substances status: Secondary | ICD-10-CM

## 2018-08-29 DIAGNOSIS — W230XXA Caught, crushed, jammed, or pinched between moving objects, initial encounter: Secondary | ICD-10-CM | POA: Diagnosis present

## 2018-08-29 DIAGNOSIS — E559 Vitamin D deficiency, unspecified: Secondary | ICD-10-CM | POA: Diagnosis present

## 2018-08-29 DIAGNOSIS — E1122 Type 2 diabetes mellitus with diabetic chronic kidney disease: Secondary | ICD-10-CM | POA: Diagnosis present

## 2018-08-29 DIAGNOSIS — I129 Hypertensive chronic kidney disease with stage 1 through stage 4 chronic kidney disease, or unspecified chronic kidney disease: Secondary | ICD-10-CM | POA: Diagnosis present

## 2018-08-29 DIAGNOSIS — M62221 Nontraumatic ischemic infarction of muscle, right upper arm: Principal | ICD-10-CM | POA: Diagnosis present

## 2018-08-29 DIAGNOSIS — Z808 Family history of malignant neoplasm of other organs or systems: Secondary | ICD-10-CM

## 2018-08-29 DIAGNOSIS — Z886 Allergy status to analgesic agent status: Secondary | ICD-10-CM | POA: Diagnosis not present

## 2018-08-29 DIAGNOSIS — Z880 Allergy status to penicillin: Secondary | ICD-10-CM | POA: Diagnosis not present

## 2018-08-29 DIAGNOSIS — I469 Cardiac arrest, cause unspecified: Secondary | ICD-10-CM | POA: Diagnosis present

## 2018-08-29 DIAGNOSIS — S471XXA Crushing injury of right shoulder and upper arm, initial encounter: Secondary | ICD-10-CM

## 2018-08-29 DIAGNOSIS — N178 Other acute kidney failure: Secondary | ICD-10-CM | POA: Diagnosis not present

## 2018-08-29 DIAGNOSIS — N183 Chronic kidney disease, stage 3 (moderate): Secondary | ICD-10-CM | POA: Diagnosis present

## 2018-08-29 DIAGNOSIS — M25511 Pain in right shoulder: Secondary | ICD-10-CM | POA: Diagnosis present

## 2018-08-29 DIAGNOSIS — I998 Other disorder of circulatory system: Secondary | ICD-10-CM

## 2018-08-29 DIAGNOSIS — G8929 Other chronic pain: Secondary | ICD-10-CM | POA: Diagnosis present

## 2018-08-29 DIAGNOSIS — H409 Unspecified glaucoma: Secondary | ICD-10-CM | POA: Diagnosis present

## 2018-08-29 DIAGNOSIS — Z8249 Family history of ischemic heart disease and other diseases of the circulatory system: Secondary | ICD-10-CM

## 2018-08-29 DIAGNOSIS — I742 Embolism and thrombosis of arteries of the upper extremities: Secondary | ICD-10-CM | POA: Diagnosis present

## 2018-08-29 DIAGNOSIS — E1142 Type 2 diabetes mellitus with diabetic polyneuropathy: Secondary | ICD-10-CM | POA: Diagnosis present

## 2018-08-29 HISTORY — PX: THROMBECTOMY W/ EMBOLECTOMY: SHX2507

## 2018-08-29 LAB — GLUCOSE, CAPILLARY: Glucose-Capillary: 149 mg/dL — ABNORMAL HIGH (ref 70–99)

## 2018-08-29 LAB — CBC WITH DIFFERENTIAL/PLATELET
Abs Immature Granulocytes: 0.08 10*3/uL — ABNORMAL HIGH (ref 0.00–0.07)
BASOS ABS: 0 10*3/uL (ref 0.0–0.1)
Basophils Relative: 0 %
Eosinophils Absolute: 0 10*3/uL (ref 0.0–0.5)
Eosinophils Relative: 0 %
HEMATOCRIT: 45.1 % (ref 36.0–46.0)
HEMOGLOBIN: 14.8 g/dL (ref 12.0–15.0)
Immature Granulocytes: 1 %
LYMPHS ABS: 0.9 10*3/uL (ref 0.7–4.0)
Lymphocytes Relative: 5 %
MCH: 29.3 pg (ref 26.0–34.0)
MCHC: 32.8 g/dL (ref 30.0–36.0)
MCV: 89.3 fL (ref 80.0–100.0)
Monocytes Absolute: 1.2 10*3/uL — ABNORMAL HIGH (ref 0.1–1.0)
Monocytes Relative: 7 %
NRBC: 0 % (ref 0.0–0.2)
Neutro Abs: 15.4 10*3/uL — ABNORMAL HIGH (ref 1.7–7.7)
Neutrophils Relative %: 87 %
Platelets: 270 10*3/uL (ref 150–400)
RBC: 5.05 MIL/uL (ref 3.87–5.11)
RDW: 13.3 % (ref 11.5–15.5)
WBC: 17.5 10*3/uL — AB (ref 4.0–10.5)

## 2018-08-29 LAB — TYPE AND SCREEN
ABO/RH(D): A POS
Antibody Screen: NEGATIVE

## 2018-08-29 LAB — I-STAT CHEM 8, ED
BUN: 57 mg/dL — AB (ref 8–23)
CHLORIDE: 105 mmol/L (ref 98–111)
Calcium, Ion: 1.2 mmol/L (ref 1.15–1.40)
Creatinine, Ser: 3.1 mg/dL — ABNORMAL HIGH (ref 0.44–1.00)
Glucose, Bld: 151 mg/dL — ABNORMAL HIGH (ref 70–99)
HEMATOCRIT: 48 % — AB (ref 36.0–46.0)
Hemoglobin: 16.3 g/dL — ABNORMAL HIGH (ref 12.0–15.0)
Potassium: 4.2 mmol/L (ref 3.5–5.1)
SODIUM: 137 mmol/L (ref 135–145)
TCO2: 20 mmol/L — ABNORMAL LOW (ref 22–32)

## 2018-08-29 SURGERY — THROMBECTOMY ARTERIOVENOUS FISTULA
Anesthesia: General | Laterality: Right

## 2018-08-29 MED ORDER — GLYCOPYRROLATE PF 0.2 MG/ML IJ SOSY
PREFILLED_SYRINGE | INTRAMUSCULAR | Status: DC | PRN
  Administered 2018-08-29: 0.4 mg via INTRAVENOUS

## 2018-08-29 MED ORDER — ALBUMIN HUMAN 5 % IV SOLN
INTRAVENOUS | Status: DC | PRN
  Administered 2018-08-29: 19:00:00 via INTRAVENOUS

## 2018-08-29 MED ORDER — METOCLOPRAMIDE HCL 5 MG/ML IJ SOLN
5.0000 mg | Freq: Once | INTRAMUSCULAR | Status: AC
  Administered 2018-08-30: 5 mg via INTRAVENOUS

## 2018-08-29 MED ORDER — SUCCINYLCHOLINE CHLORIDE 20 MG/ML IJ SOLN
INTRAMUSCULAR | Status: DC | PRN
  Administered 2018-08-29: 100 mg via INTRAVENOUS

## 2018-08-29 MED ORDER — LIDOCAINE 2% (20 MG/ML) 5 ML SYRINGE
INTRAMUSCULAR | Status: DC | PRN
  Administered 2018-08-29: 30 mg via INTRAVENOUS

## 2018-08-29 MED ORDER — SODIUM CHLORIDE 0.9 % IV SOLN
INTRAVENOUS | Status: DC | PRN
  Administered 2018-08-29: 500 mL

## 2018-08-29 MED ORDER — MEPERIDINE HCL 50 MG/ML IJ SOLN: 6.2500 mg | INTRAMUSCULAR | Status: DC | PRN

## 2018-08-29 MED ORDER — SODIUM CHLORIDE 0.9 % IV SOLN
INTRAVENOUS | Status: DC
  Administered 2018-08-29 (×2): via INTRAVENOUS
  Administered 2018-08-29: 100 mL/h via INTRAVENOUS
  Administered 2018-08-29: 19:00:00 via INTRAVENOUS

## 2018-08-29 MED ORDER — NEOSTIGMINE METHYLSULFATE 10 MG/10ML IV SOLN
INTRAVENOUS | Status: DC | PRN
  Administered 2018-08-29: 3 mg via INTRAVENOUS

## 2018-08-29 MED ORDER — CISATRACURIUM BESYLATE (PF) 10 MG/5ML IV SOLN
INTRAVENOUS | Status: DC | PRN
  Administered 2018-08-29: 4 mg via INTRAVENOUS
  Administered 2018-08-29 (×2): 2 mg via INTRAVENOUS

## 2018-08-29 MED ORDER — SODIUM CHLORIDE 0.9 % IV SOLN
Freq: Once | INTRAVENOUS | Status: AC
  Administered 2018-08-29: 17:00:00 via INTRAVENOUS

## 2018-08-29 MED ORDER — ONDANSETRON HCL 4 MG/2ML IJ SOLN
INTRAMUSCULAR | Status: DC | PRN
  Administered 2018-08-29: 4 mg via INTRAVENOUS

## 2018-08-29 MED ORDER — HEPARIN (PORCINE) IN NACL 100-0.45 UNIT/ML-% IJ SOLN
1200.0000 [IU]/h | INTRAMUSCULAR | Status: DC
  Administered 2018-08-29: 1200 [IU]/h via INTRAVENOUS
  Filled 2018-08-29: qty 250

## 2018-08-29 MED ORDER — FENTANYL CITRATE (PF) 100 MCG/2ML IJ SOLN
INTRAMUSCULAR | Status: DC | PRN
  Administered 2018-08-29 (×3): 50 ug via INTRAVENOUS

## 2018-08-29 MED ORDER — SODIUM CHLORIDE 0.9 % IV SOLN
INTRAVENOUS | Status: DC
  Administered 2018-08-29: 23:00:00 via INTRAVENOUS

## 2018-08-29 MED ORDER — CEFAZOLIN SODIUM-DEXTROSE 2-3 GM-%(50ML) IV SOLR
INTRAVENOUS | Status: DC | PRN
  Administered 2018-08-29: 2 g via INTRAVENOUS

## 2018-08-29 MED ORDER — LACTATED RINGERS IV BOLUS: 1000.0000 mL | Freq: Once | INTRAVENOUS | Status: DC

## 2018-08-29 MED ORDER — HEPARIN (PORCINE) IN NACL 100-0.45 UNIT/ML-% IJ SOLN
INTRAMUSCULAR | Status: DC | PRN
  Administered 2018-08-29 (×2): 1200 [IU]/kg/h via INTRAVENOUS

## 2018-08-29 MED ORDER — HEPARIN BOLUS VIA INFUSION
4000.0000 [IU] | Freq: Once | INTRAVENOUS | Status: AC
  Administered 2018-08-29: 4000 [IU] via INTRAVENOUS
  Filled 2018-08-29: qty 4000

## 2018-08-29 MED ORDER — PROPOFOL 10 MG/ML IV BOLUS
INTRAVENOUS | Status: DC | PRN
  Administered 2018-08-29: 110 mg via INTRAVENOUS
  Administered 2018-08-29: 50 mg via INTRAVENOUS

## 2018-08-29 MED ORDER — FENTANYL CITRATE (PF) 100 MCG/2ML IJ SOLN: 25.0000 ug | INTRAMUSCULAR | Status: DC | PRN

## 2018-08-29 MED ORDER — METOCLOPRAMIDE HCL 5 MG/ML IJ SOLN
INTRAMUSCULAR | Status: AC
  Filled 2018-08-29: qty 2

## 2018-08-29 MED ORDER — SODIUM CHLORIDE 0.9 % IV SOLN
INTRAVENOUS | Status: DC | PRN
  Administered 2018-08-29: 25 ug/min via INTRAVENOUS

## 2018-08-29 MED ORDER — 0.9 % SODIUM CHLORIDE (POUR BTL) OPTIME
TOPICAL | Status: DC | PRN
  Administered 2018-08-29: 1000 mL

## 2018-08-29 MED ORDER — HEPARIN SODIUM (PORCINE) 1000 UNIT/ML IJ SOLN
INTRAMUSCULAR | Status: DC | PRN
  Administered 2018-08-29: 5000 [IU] via INTRAVENOUS
  Administered 2018-08-29: 8000 [IU] via INTRAVENOUS

## 2018-08-29 SURGICAL SUPPLY — 51 items
ADH SKN CLS APL DERMABOND .7 (GAUZE/BANDAGES/DRESSINGS) ×1
AGENT HMST SPONGE THK3/8 (HEMOSTASIS)
ARMBAND PINK RESTRICT EXTREMIT (MISCELLANEOUS) ×2 IMPLANT
BANDAGE ELASTIC 4 VELCRO ST LF (GAUZE/BANDAGES/DRESSINGS) ×1 IMPLANT
BNDG GAUZE ELAST 4 BULKY (GAUZE/BANDAGES/DRESSINGS) ×1 IMPLANT
CANISTER SUCT 3000ML PPV (MISCELLANEOUS) ×2 IMPLANT
CANNULA VESSEL 3MM 2 BLNT TIP (CANNULA) IMPLANT
CATH EMB 3FR 80CM (CATHETERS) ×1 IMPLANT
CATH EMB 4FR 80CM (CATHETERS) ×3 IMPLANT
CLIP VESOCCLUDE MED 6/CT (CLIP) ×2 IMPLANT
CLIP VESOCCLUDE SM WIDE 6/CT (CLIP) ×2 IMPLANT
COVER WAND RF STERILE (DRAPES) ×2 IMPLANT
DECANTER SPIKE VIAL GLASS SM (MISCELLANEOUS) ×2 IMPLANT
DERMABOND ADVANCED (GAUZE/BANDAGES/DRESSINGS) ×1
DERMABOND ADVANCED .7 DNX12 (GAUZE/BANDAGES/DRESSINGS) ×1 IMPLANT
DRAPE X-RAY CASS 24X20 (DRAPES) IMPLANT
ELECT REM PT RETURN 9FT ADLT (ELECTROSURGICAL) ×2
ELECTRODE REM PT RTRN 9FT ADLT (ELECTROSURGICAL) ×1 IMPLANT
GAUZE 4X4 16PLY RFD (DISPOSABLE) ×1 IMPLANT
GAUZE SPONGE 4X4 12PLY STRL LF (GAUZE/BANDAGES/DRESSINGS) ×1 IMPLANT
GLOVE BIO SURGEON STRL SZ7.5 (GLOVE) ×3 IMPLANT
GLOVE BIOGEL PI IND STRL 7.0 (GLOVE) IMPLANT
GLOVE BIOGEL PI IND STRL 7.5 (GLOVE) IMPLANT
GLOVE BIOGEL PI INDICATOR 7.0 (GLOVE) ×3
GLOVE BIOGEL PI INDICATOR 7.5 (GLOVE) ×1
GLOVE ECLIPSE 7.0 STRL STRAW (GLOVE) ×1 IMPLANT
GLOVE ECLIPSE 8.0 STRL XLNG CF (GLOVE) ×1 IMPLANT
GOWN STRL REUS W/ TWL LRG LVL3 (GOWN DISPOSABLE) ×3 IMPLANT
GOWN STRL REUS W/TWL LRG LVL3 (GOWN DISPOSABLE) ×8
HEMOSTAT SPONGE AVITENE ULTRA (HEMOSTASIS) IMPLANT
KIT BASIN OR (CUSTOM PROCEDURE TRAY) ×2 IMPLANT
KIT TURNOVER KIT B (KITS) ×2 IMPLANT
LOOP VESSEL MINI RED (MISCELLANEOUS) ×2 IMPLANT
NS IRRIG 1000ML POUR BTL (IV SOLUTION) ×2 IMPLANT
PACK CV ACCESS (CUSTOM PROCEDURE TRAY) ×2 IMPLANT
PAD ABD 8X10 STRL (GAUZE/BANDAGES/DRESSINGS) ×2 IMPLANT
PAD ARMBOARD 7.5X6 YLW CONV (MISCELLANEOUS) ×4 IMPLANT
SET COLLECT BLD 21X3/4 12 (NEEDLE) IMPLANT
STAPLER VISISTAT 35W (STAPLE) ×1 IMPLANT
STOPCOCK 4 WAY LG BORE MALE ST (IV SETS) IMPLANT
SUT PROLENE 6 0 CC (SUTURE) ×3 IMPLANT
SUT PROLENE 7 0 BV 1 (SUTURE) ×1 IMPLANT
SUT PROLENE 7 0 BV1 MDA (SUTURE) ×3 IMPLANT
SUT VIC AB 3-0 SH 27 (SUTURE) ×2
SUT VIC AB 3-0 SH 27X BRD (SUTURE) ×1 IMPLANT
SUT VICRYL 4-0 PS2 18IN ABS (SUTURE) ×2 IMPLANT
TOWEL GREEN STERILE (TOWEL DISPOSABLE) ×2 IMPLANT
TRAY FOLEY MTR SLVR 16FR STAT (SET/KITS/TRAYS/PACK) ×1 IMPLANT
TUBING EXTENTION W/L.L. (IV SETS) IMPLANT
UNDERPAD 30X30 (UNDERPADS AND DIAPERS) ×2 IMPLANT
WATER STERILE IRR 1000ML POUR (IV SOLUTION) ×2 IMPLANT

## 2018-08-29 NOTE — Progress Notes (Addendum)
ANTICOAGULATION CONSULT NOTE - Initial Consult  Pharmacy Consult for Heparin Indication: brachial artery thrombosis  Allergies  Allergen Reactions  . Celebrex [Celecoxib] Swelling  . Oxaprozin Swelling  . Penicillins Itching    Has patient had a PCN reaction causing immediate rash, facial/tongue/throat swelling, SOB or lightheadedness with hypotension: No Has patient had a PCN reaction causing severe rash involving mucus membranes or skin necrosis: No Has patient had a PCN reaction that required hospitalization: No Has patient had a PCN reaction occurring within the last 10 years: No If all of the above answers are "NO", then may proceed with Cephalosporin use.  . Aspirin Other (See Comments)    Uncoated clear liquid come up in her mouth if taken on an empty stomach    Patient Measurements: Height: 5\' 6"  (167.6 cm) Weight: 170 lb (77.1 kg) IBW/kg (Calculated) : 59.3 Heparin Dosing Weight: 75 kg  Vital Signs: Temp: 97.5 F (36.4 C) (11/04 1348) Temp Source: Oral (11/04 1348) BP: 171/90 (11/04 1500) Pulse Rate: 92 (11/04 1348)  Labs: Recent Labs    09-09-2018 1559  HGB 16.3*  HCT 48.0*  CREATININE 3.10*    Estimated Creatinine Clearance: 11.6 mL/min (A) (by C-G formula based on SCr of 3.1 mg/dL (H)).   Medical History: Past Medical History:  Diagnosis Date  . Anemia    OF CHRONIC DISEASE  . Arthritis   . Chronic renal insufficiency   . Diabetes mellitus without complication (HCC)   . Dysphagia   . Glaucoma   . Hyperlipidemia   . Hypertension   . Insomnia   . Osteopenia   . Peripheral neuropathy   . Reflux    HOARSENESS  . Restless leg syndrome   . Vitamin D deficiency     Assessment: Patient is a 65 yoF whose arm was trapped in her bed for 2 days. Pharmacy has been consulted for heparin dosing for possible brachial artery thrombosis. No anticoagulation PTA.   HgB stable; platelets pending, but have been normal in the past.    Goal of Therapy:   Heparin level 0.3-0.7 units/ml Monitor platelets by anticoagulation protocol: Yes   Plan:  Give 4000 units bolus x 1 Start heparin infusion at 1200 units/hr Check anti-Xa level in 8 hours and daily while on heparin Continue to monitor H&H and platelets  Monitor for signs and symptoms of bleeding   Chauncey Mann, Pharmacy Student

## 2018-08-29 NOTE — ED Notes (Signed)
Patient's family bedside.

## 2018-08-29 NOTE — Op Note (Signed)
Procedure: Right brachial radial and ulnar thrombectomy  Preoperative diagnosis: Ischemia right upper extremity  Postoperative diagnosis: Same  Anesthesia: General  Assistant: Lianne Cure, PA-C  Operative findings: Subacute thrombus right brachial ulnar radial artery  Operative details: After obtaining informed consent, the patient was taken the operating.  The patient was placed in supine position operating table.  After induction of general anesthesia and endotracheal intubation, the patient's entire right upper extremities prepped and draped in usual sterile fashion.  Next a longitudinal incision was made near the antecubital crease carried down through the subtenons tissues down level the right brachial artery.  This was dissected free circumferentially.  Several overlying veins were ligated and divided between silk ties to expose the brachial bifurcation.  Tissues were very pale and ischemic in appearance.  The muscle was noncontractile.  The radial and ulnar arteries were dissected free circumferentially and Vesseloops were placed around these.  A vessel loop was also placed around the brachial artery trunk.  There was no pulse within the artery.  There was thrombus visible within the artery.  The patient's heparin drip was stopped at this time and she was given a bolus of 7000 units of intravenous heparin.  After 2 minutes circulation time, a transverse arteriotomy was made in the brachial artery just above the brachial bifurcation.  #3 and 4 Fogarty catheters were passed proximally and distally down the radial ulnar and brachial arteries.  I was able to pass the #3 Fogarty catheter all the way to the level of the wrist and the ulnar and radial with return of a lot of chronic oil type looking thrombus material and subacute whitish looking thrombus as well.  There was minimal backbleeding.  I tried to pass a 3 and 4 Fogarty catheter proximally but could not get adequate arterial inflow.  The  catheter Sticking out about the shoulder level which was the area of the patient's injury.  At this point a longitudinal incision was made high in the axilla and the brachial artery was exposed in this location.  I tried all maneuvers possible to protect the nerves in this location but dissection was fairly difficult due to the pale ischemic appearance of the tissues which made structures more difficult to define.  I placed the vessel loop proximally distally in the brachial artery in this location.  At this point Dr. Izora Ribas from hand surgery performed a fasciotomy in the forearm and dorsal aspect of the wrist.  All the muscle in this area was noncontractile ischemic in appearance.  Although not grossly infarcted most of this tissue did not appear to be viable.  At this point an additional transverse arteriotomy was made in the high brachial artery in the level of the axilla I was able to finally pass a #4 Fogarty catheter all the way into the central circulation and retrieving arterialized flow again established good arterial inflow.  This arteriotomy was repaired with interrupted 7-0 Prolene sutures.  I then repaired the previous brachial artery arteriotomy at the antecubital area with interrupted 7-0 Prolene sutures.  At completion anastomosis over them was forebled backbled and thoroughly flushed.  Clamps were open there is palpable radial and good Doppler flow in the ulnar artery immediately.  There was bleeding from muscle and subcutaneous tissues at this point.  This was controlled with cautery.  Patient's heparin drip was discontinued.  She had been given an additional 5000 unit of heparin during the course of the case.  The dorsal fasciotomy incision was closed  at the skin level but the fascia was left open.  I covered the antecubital brachial anastomosis with a running 3-0 Vicryl suture in the subcutaneous tissues and then stapled over this.  The distal aspect of the incision was left open and packed.  The  axillary incision was closed with the staples in the skin.  The patient tolerated procedure well and there were no complications.  The instrument sponge needle count was correct in the case.  The patient was taken to recovery room stable condition.  Operative management: Patient had a very ischemic appearing arm which may not be salvageable.  We will assess her over the next few days to see whether or not the arm is salvageable or whether or not she would need a right forequarter amputation.  Fabienne Bruns, MD [facility] Office: [phone] Pager: [phone]

## 2018-08-29 NOTE — Progress Notes (Signed)
No longer needs PIV at this time PIV started by MD

## 2018-08-29 NOTE — ED Notes (Signed)
Missed IV x2.

## 2018-08-29 NOTE — Consult Note (Signed)
S: Asked by Vascular Surgery Dr Darrick Penna to see patient and evaluate RUE.  Pt had RUE trapped or pinned between wall and bed for ~48 prior to hospital admission.  Dr. Darrick Penna consulted because of a pulseless cold RUE, with by history minimal motor function prior to OR. Plan was to attempt revascularization of extremity with possible need for fasciotomy.  I evaluated the pt's RUE in the OR.  Currently, no flow from distal axillary artery distally has been established.  O: entire RUE from upper arm to hand cold, tissues appear pale; limited volar and dorsal forearm fasciotomies performed, multiple muscle groups evaluated - no muscle contraction with cautery stimulation.  Hand/wrist stiff with minimal passive motion, fingers held in flexed position   A: Ischemic RUE with absent blood flow, doubt nerve function, no evidence of muscle function; non salvageable extremity   P: Dr. Darrick Penna to attempt revascularization more proximal.  Pt will need amputation of RUE.

## 2018-08-29 NOTE — Anesthesia Procedure Notes (Signed)
Procedure Name: Intubation Date/Time: 09/15/2018 6:38 PM Performed by: Eligha Bridegroom, CRNA Pre-anesthesia Checklist: Patient identified, Emergency Drugs available, Suction available, Patient being monitored and Timeout performed Patient Re-evaluated:Patient Re-evaluated prior to induction Oxygen Delivery Method: Circle system utilized Preoxygenation: Pre-oxygenation with 100% oxygen Induction Type: IV induction Ventilation: Mask ventilation without difficulty Laryngoscope Size: Mac and 3 Grade View: Grade I Tube type: Oral Tube size: 7.0 mm Number of attempts: 1 Airway Equipment and Method: Video-laryngoscopy Placement Confirmation: ETT inserted through vocal cords under direct vision,  positive ETCO2 and breath sounds checked- equal and bilateral Secured at: 21 cm Tube secured with: Tape Dental Injury: Teeth and Oropharynx as per pre-operative assessment

## 2018-08-29 NOTE — H&P (Signed)
Patient name: Adrienne Ortiz MRN: [id number] DOB: [birth year] Sex: female  REASON FOR CONSULT: right arm ischemia  Requesting: [place]  HPI: Adrienne Ortiz is a 84 y.o. female, with 2 day history of right arm wedged between bed and wall to arm pit level.  Pt hand was noted to be cool but slightly improved in ER.  She cannot move the right hand or wrist or elbow.  She is slightly confused to situation but is able to follow commands most of history provided by family. Other medical problems include diabetes peripheral neuropathy hypertension hyperlipidemia which have been stable.  She complains of some right hip and back pain.  According to family she was very functional and alert prior to this and driving on her own.  Apparently her left leg was also bent behind her in an awkward position.  Past Medical History:  Diagnosis Date  . Anemia    OF CHRONIC DISEASE  . Arthritis   . Chronic renal insufficiency   . Diabetes mellitus without complication (HCC)   . Dysphagia   . Glaucoma   . Hyperlipidemia   . Hypertension   . Insomnia   . Osteopenia   . Peripheral neuropathy   . Reflux    HOARSENESS  . Restless leg syndrome   . Vitamin D deficiency    Past Surgical History:  Procedure Laterality Date  . ABDOMINAL SURGERIES     2 PRIOR?  . BREAST LUMPECTOMY     RIGHT  . COLONOSCOPY  2006   DR [facility]  . REPAIR OF TORN LIGAMENT     RIGHT KNEE  . ROTATER CUFF SURGERY      Family History  Problem Relation Age of Onset  . Cancer Mother   . Heart disease Father   . Hypertension Father   . Lung cancer Sister   . Throat cancer Brother   . Throat cancer Brother     SOCIAL HISTORY: Social History   Socioeconomic History  . Marital status: Widowed    Spouse name: Not on file  . Number of children: Not on file  . Years of education: Not on file  . Highest education level: Not on file  Occupational History  . Not on file  Social Needs  . Financial resource strain: Not on  file  . Food insecurity:    Worry: Not on file    Inability: Not on file  . Transportation needs:    Medical: Not on file    Non-medical: Not on file  Tobacco Use  . Smoking status: Former Games developer  . Smokeless tobacco: Never Used  Substance and Sexual Activity  . Alcohol use: Not on file  . Drug use: Not on file  . Sexual activity: Not on file  Lifestyle  . Physical activity:    Days per week: Not on file    Minutes per session: Not on file  . Stress: Not on file  Relationships  . Social connections:    Talks on phone: Not on file    Gets together: Not on file    Attends religious service: Not on file    Active member of club or organization: Not on file    Attends meetings of clubs or organizations: Not on file    Relationship status: Not on file  . Intimate partner violence:    Fear of current or ex partner: Not on file    Emotionally abused: Not on file    Physically abused:  Not on file    Forced sexual activity: Not on file  Other Topics Concern  . Not on file  Social History Narrative  . Not on file    Allergies  Allergen Reactions  . Celebrex [Celecoxib] Swelling  . Oxaprozin Swelling  . Penicillins Itching    Has patient had a PCN reaction causing immediate rash, facial/tongue/throat swelling, SOB or lightheadedness with hypotension: No Has patient had a PCN reaction causing severe rash involving mucus membranes or skin necrosis: No Has patient had a PCN reaction that required hospitalization: No Has patient had a PCN reaction occurring within the last 10 years: No If all of the above answers are "NO", then may proceed with Cephalosporin use.  . Aspirin Other (See Comments)    Uncoated clear liquid come up in her mouth if taken on an empty stomach    Current Facility-Administered Medications  Medication Dose Route Frequency Provider Last Rate Last Dose  . lactated ringers bolus 1,000 mL  1,000 mL Intravenous Once Melene Plan, DO       Current Outpatient  Medications  Medication Sig Dispense Refill  . acetaminophen (TYLENOL) 325 MG tablet Take 650 mg by mouth 2 (two) times daily as needed for moderate pain (arthiritis).     . brinzolamide (AZOPT) 1 % ophthalmic suspension Place 1 drop into the right eye 3 (three) times daily.    . cholecalciferol (VITAMIN D) 1000 UNITS tablet Take 1,000 Units by mouth daily.    . ciprofloxacin (CIPRO) 500 MG tablet Take 1 tablet (500 mg total) by mouth 2 (two) times daily. X 10 DAYS 20 tablet 0  . latanoprost (XALATAN) 0.005 % ophthalmic solution Place 1 drop into the right eye at bedtime.    . metroNIDAZOLE (FLAGYL) 500 MG tablet Take 1 tablet (500 mg total) by mouth 3 (three) times daily. X 10 DAYS 30 tablet 0  . omeprazole (PRILOSEC) 40 MG capsule Take 40 mg by mouth daily.    . ondansetron (ZOFRAN ODT) 4 MG disintegrating tablet Take 1 tablet (4 mg total) by mouth every 8 (eight) hours as needed for nausea or vomiting. 20 tablet 0  . Polyethyl Glycol-Propyl Glycol (SYSTANE) 0.4-0.3 % SOLN Apply to eye as needed.    . quinapril (ACCUPRIL) 10 MG tablet Take 1 tablet (10 mg total) by mouth daily. 7 tablet 0  . quinapril-hydrochlorothiazide (ACCURETIC) 10-12.5 MG tablet Take 1 tablet by mouth daily. HOLD for a week.    . simvastatin (ZOCOR) 20 MG tablet Take 20 mg by mouth at bedtime.     . timolol (BETIMOL) 0.5 % ophthalmic solution Place 1 drop into the right eye 2 (two) times daily.      ROS:   General:  No weight loss, Fever, chills  HEENT: No recent headaches, no nasal bleeding, no visual changes, no sore throat  Neurologic: No dizziness, blackouts, seizures. No recent symptoms of stroke or mini- stroke. No recent episodes of slurred speech, or temporary blindness.  Cardiac: No recent episodes of chest pain/pressure, no shortness of breath at rest.  + shortness of breath with exertion.  Denies history of atrial fibrillation or irregular heartbeat  Vascular: No history of rest pain in feet.  No history  of claudication.  No history of non-healing ulcer, No history of DVT   Pulmonary: No home oxygen, no productive cough, no hemoptysis,  No asthma or wheezing  Musculoskeletal:  [X]  Arthritis, [X]  Low back pain,  [X]  Joint pain  Hematologic:No history of hypercoagulable state.  No history of easy bleeding.  No history of anemia  Gastrointestinal: No hematochezia or melena,  No gastroesophageal reflux, no trouble swallowing  Urinary: [ ]  chronic Kidney disease, [ ]  on HD - [ ]  MWF or [ ]  TTHS, [ ]  Burning with urination, [ ]  Frequent urination, [ ]  Difficulty urinating;   Skin: No rashes  Psychological: No history of anxiety,  No history of depression   Physical Examination  Vitals:   09/21/18 1330 Sep 21, 2018 1348 09/21/18 1445 21-Sep-2018 1500  BP:  (!) 166/87 (!) 169/81 (!) 171/90  Pulse:  92    Resp:  20 19 (!) 21  Temp:  (!) 97.5 F (36.4 C)    TempSrc:  Oral    SpO2:      Weight: 77.1 kg     Height: 5\' 6"  (1.676 m)       Body mass index is 27.44 kg/m.  General:  Alert and oriented, no acute distress HEENT: right periorbital ecchymosis, EOMI PERRL Neck: No JVD Pulmonary: Clear to auscultation bilaterally Cardiac: Regular Rate and Rhythm  Abdomen: Soft, non-tender, non-distended, no mass Skin: No rash, small skin tear with some surrounding erythema right hip Extremity Pulses:  1+ left radial, brachial,2+ left femoral, left dorsalis pedis, posterior tibial doppler right DP doppler absent PT doppler bilaterally, right arm no brachial radial or ulnar doppler Musculoskeletal: left ankle degenerative changes Neurologic: left upper extremity and lower  Extremities 4/5 motor, right upper extremity 0/5 motor with contraction of hand fingers  DATA:  Head CT neg bleed  CBC    Component Value Date/Time   WBC 7.9 04/08/2017 1358   RBC 3.52 (L) 04/08/2017 1358   HGB 16.3 (H) 2018/09/21 1559   HCT 48.0 (H) 09-21-18 1559   PLT 248 04/08/2017 1358   MCV 88.4 04/08/2017 1358    MCH 29.5 04/08/2017 1358   MCHC 33.4 04/08/2017 1358   RDW 14.0 04/08/2017 1358   LYMPHSABS 2.5 04/02/2017 1202   MONOABS 0.8 04/02/2017 1202   EOSABS 0.5 04/02/2017 1202   BASOSABS 0.0 04/02/2017 1202    BMET    Component Value Date/Time   NA 137 Sep 21, 2018 1559   K 4.2 09/21/2018 1559   CL 105 21-Sep-2018 1559   CO2 20 (L) 04/08/2017 1358   GLUCOSE 151 (H) September 21, 2018 1559   BUN 57 (H) 2018-09-21 1559   CREATININE 3.10 (H) 09/21/18 1559   CALCIUM 8.7 (L) 04/08/2017 1358   GFRNONAA 38 (L) 04/08/2017 1358   GFRAA 45 (L) 04/08/2017 1358     ASSESSMENT:  1. Right arm ischemia with significant neuro deficit  2. Acute renal failure most likely prerenal but possible also due to myoglobin   PLAN:  1. NPO 2. IV fluid to improve renal function and protect kidneys 3. Thrombectomy right arm possible fasciotomy  Discussed with family risk benefits complications possible permanent neuro deficit.  They wish to proceed.  Will place on heparin until we can get her in the OR.   Fabienne Bruns, MD [facility] Office: [phone] Pager: [phone]

## 2018-08-29 NOTE — ED Notes (Signed)
Per Dr. Darrick Penna, okay to have daughter in law sign patient's consent for brachial thrombectomy.

## 2018-08-29 NOTE — Anesthesia Postprocedure Evaluation (Signed)
Anesthesia Post Note  Patient: Adrienne Ortiz  Procedure(s) Performed: THROMBECTOMY of  RIGHT BRACHIAL ARTERY AND RIGHT AXILLARY ARTERY (Right )     Patient location during evaluation: PACU Anesthesia Type: General Level of consciousness: awake Pain management: pain level controlled Vital Signs Assessment: post-procedure vital signs reviewed and stable Respiratory status: spontaneous breathing Cardiovascular status: stable Postop Assessment: no apparent nausea or vomiting Anesthetic complications: no    Last Vitals:  Vitals:   09/14/2018 2239 09/18/2018 2254  BP: (!) 164/62 (!) 163/64  Pulse:    Resp: 19 19  Temp:    SpO2:      Last Pain:  Vitals:   09/22/2018 2139  TempSrc:   PainSc: 0-No pain                 Blayke Pinera

## 2018-08-29 NOTE — Anesthesia Preprocedure Evaluation (Signed)
Anesthesia Evaluation  Patient identified by MRN, date of birth, ID band Patient awake    Reviewed: Allergy & Precautions, NPO status , Patient's Chart, lab work & pertinent test results  Airway Mallampati: II  TM Distance: >3 FB Neck ROM: Full    Dental no notable dental hx.    Pulmonary neg pulmonary ROS, former smoker,    Pulmonary exam normal breath sounds clear to auscultation       Cardiovascular hypertension, Pt. on medications Normal cardiovascular exam Rhythm:Regular Rate:Normal     Neuro/Psych negative neurological ROS  negative psych ROS   GI/Hepatic negative GI ROS, Neg liver ROS,   Endo/Other  diabetes, Type 2  Renal/GU Renal disease     Musculoskeletal  (+) Arthritis ,   Abdominal   Peds  Hematology negative hematology ROS (+) anemia ,   Anesthesia Other Findings   Reproductive/Obstetrics negative OB ROS                             Anesthesia Physical Anesthesia Plan  ASA: IV and emergent  Anesthesia Plan: General   Post-op Pain Management:    Induction: Intravenous  PONV Risk Score and Plan: 2  Airway Management Planned: Oral ETT  Additional Equipment: None  Intra-op Plan:   Post-operative Plan: Extubation in OR  Informed Consent: I have reviewed the patients History and Physical, chart, labs and discussed the procedure including the risks, benefits and alternatives for the proposed anesthesia with the patient or authorized representative who has indicated his/her understanding and acceptance.   Dental advisory given  Plan Discussed with: CRNA  Anesthesia Plan Comments:         Anesthesia Quick Evaluation

## 2018-08-29 NOTE — Transfer of Care (Signed)
Immediate Anesthesia Transfer of Care Note  Patient: Adrienne Ortiz  Procedure(s) Performed: THROMBECTOMY of  RIGHT BRACHIAL ARTERY AND RIGHT AXILLARY ARTERY (Right )  Patient Location: PACU  Anesthesia Type:General  Level of Consciousness: sedated and responds to stimulation  Airway & Oxygen Therapy: Patient Spontanous Breathing and Patient connected to nasal cannula oxygen  Post-op Assessment: Report given to RN, Post -op Vital signs reviewed and stable and Patient moving all extremities  Post vital signs: Reviewed and stable  Last Vitals:  Vitals Value Taken Time  BP 121/65 09/05/2018  9:39 PM  Temp    Pulse    Resp 21 09/08/2018  9:44 PM  SpO2    Vitals shown include unvalidated device data.  Last Pain:  Vitals:   09/08/2018 1348  TempSrc: Oral  PainSc:       Patients Stated Pain Goal: 0 (09/22/2018 1329)  Complications: No apparent anesthesia complications

## 2018-08-29 NOTE — ED Provider Notes (Addendum)
[facility] EMERGENCY DEPARTMENT Provider Note   CSN: [id number] Arrival date & time: 09/23/2018  1332     History   Chief Complaint Chief Complaint  Patient presents with  . Fall    HPI Adrienne Ortiz is a 84 y.o. female.  84 yo F with a chief complaint of getting her arm trapped in her bed.  Patient was trying to change the sheets and she pushed the sheet down and got her arm stuck underneath the foot board and the bed board.  She then was trapped in that position and unable to move.  She laid there for 2 days and then friends came over to check on her and found her in that position.  They reported that her hand and arm were black.  She is noted to have a wound to her right side.  She was also somewhat confused and was not responding as she normally did.  Patient denies falling.  Denies chest pain or shortness of breath.  She has been having chronic pain to her right shoulder.  Having some discomfort to her right side.  The history is provided by the patient.  Illness  This is a new problem. The current episode started 2 days ago. The problem occurs constantly. The problem has not changed since onset.Pertinent negatives include no chest pain, no headaches and no shortness of breath. Nothing aggravates the symptoms. Nothing relieves the symptoms. She has tried nothing for the symptoms. The treatment provided no relief.    Past Medical History:  Diagnosis Date  . Anemia    OF CHRONIC DISEASE  . Arthritis   . Chronic renal insufficiency   . Diabetes mellitus without complication (HCC)   . Dysphagia   . Glaucoma   . Hyperlipidemia   . Hypertension   . Insomnia   . Osteopenia   . Peripheral neuropathy   . Reflux    HOARSENESS  . Restless leg syndrome   . Vitamin D deficiency     Patient Active Problem List   Diagnosis Date Noted  . Hypertension 04/02/2017  . Diabetes mellitus without complication (HCC) 04/02/2017  . Peripheral neuropathy 04/02/2017    . Dysphagia 04/02/2017  . Anemia 04/02/2017  . Hyperlipidemia 04/02/2017  . Rectal bleeding 04/02/2017  . Acute hypokalemia 04/02/2017  . Colitis 04/02/2017  . Acute renal failure superimposed on stage 3 chronic kidney disease (HCC) 04/02/2017  . Onychomycosis 03/09/2014  . Pain in lower limb 03/09/2014    Past Surgical History:  Procedure Laterality Date  . ABDOMINAL SURGERIES     2 PRIOR?  . BREAST LUMPECTOMY     RIGHT  . COLONOSCOPY  2006   DR [facility]  . REPAIR OF TORN LIGAMENT     RIGHT KNEE  . ROTATER CUFF SURGERY       OB History   None      Home Medications    Prior to Admission medications   Medication Sig Start Date End Date Taking? Authorizing Provider  acetaminophen (TYLENOL) 325 MG tablet Take 650 mg by mouth 2 (two) times daily as needed for moderate pain (arthiritis).     [provider]  brinzolamide (AZOPT) 1 % ophthalmic suspension Place 1 drop into the right eye 3 (three) times daily.    [provider]  cholecalciferol (VITAMIN D) 1000 UNITS tablet Take 1,000 Units by mouth daily.    [provider]  ciprofloxacin (CIPRO) 500 MG tablet Take 1 tablet (500 mg total) by  mouth 2 (two) times daily. X 10 DAYS 04/03/17   Rai, Ripudeep K, MD  latanoprost (XALATAN) 0.005 % ophthalmic solution Place 1 drop into the right eye at bedtime.    [provider]  metroNIDAZOLE (FLAGYL) 500 MG tablet Take 1 tablet (500 mg total) by mouth 3 (three) times daily. X 10 DAYS 04/03/17   Rai, Delene Ruffini, MD  omeprazole (PRILOSEC) 40 MG capsule Take 40 mg by mouth daily.    [provider]  ondansetron (ZOFRAN ODT) 4 MG disintegrating tablet Take 1 tablet (4 mg total) by mouth every 8 (eight) hours as needed for nausea or vomiting. 04/03/17   Rai, Delene Ruffini, MD  Polyethyl Glycol-Propyl Glycol (SYSTANE) 0.4-0.3 % SOLN Apply to eye as needed.    [provider]  quinapril (ACCUPRIL) 10 MG tablet Take 1 tablet (10 mg total) by mouth  daily. 04/08/17   Raeford Razor, MD  quinapril-hydrochlorothiazide (ACCURETIC) 10-12.5 MG tablet Take 1 tablet by mouth daily. HOLD for a week. 04/03/17   Rai, Delene Ruffini, MD  simvastatin (ZOCOR) 20 MG tablet Take 20 mg by mouth at bedtime.     [provider]  timolol (BETIMOL) 0.5 % ophthalmic solution Place 1 drop into the right eye 2 (two) times daily.    [provider]    Family History Family History  Problem Relation Age of Onset  . Cancer Mother   . Heart disease Father   . Hypertension Father   . Lung cancer Sister   . Throat cancer Brother   . Throat cancer Brother     Social History Social History   Tobacco Use  . Smoking status: Former Games developer  . Smokeless tobacco: Never Used  Substance Use Topics  . Alcohol use: Not on file  . Drug use: Not on file     Allergies   Celebrex [celecoxib]; Oxaprozin; Penicillins; and Aspirin   Review of Systems Review of Systems  Constitutional: Positive for activity change. Negative for chills and fever.  HENT: Negative for congestion and rhinorrhea.   Eyes: Negative for redness and visual disturbance.  Respiratory: Negative for shortness of breath and wheezing.   Cardiovascular: Negative for chest pain and palpitations.  Gastrointestinal: Negative for nausea and vomiting.  Genitourinary: Negative for dysuria and urgency.  Musculoskeletal: Positive for arthralgias and myalgias.  Skin: Negative for pallor and wound.  Neurological: Negative for dizziness and headaches.     Physical Exam Updated Vital Signs BP (!) 183/84   Pulse 92   Temp (!) 97.5 F (36.4 C) (Oral)   Resp 20   Ht 5\' 6"  (1.676 m)   Wt 77.1 kg   SpO2 100%   BMI 27.44 kg/m   Physical Exam  Constitutional: She appears well-developed and well-nourished. No distress.  HENT:  Head: Normocephalic.  Bruising to the right side of her face  Eyes: Pupils are equal, round, and reactive to light. EOM are normal.  Neck: Normal range of  motion. Neck supple.  Cardiovascular: Normal rate and regular rhythm. Exam reveals no gallop and no friction rub.  No murmur heard. Pulmonary/Chest: Effort normal. She has no wheezes. She has no rales.  Abdominal: Soft. She exhibits no distension. There is no tenderness.  Musculoskeletal: She exhibits edema. She exhibits no tenderness.  Edema to the right upper and lower extremity.  2+ DP pulse to the right and left lower extremity.  No palpable pulse to the right upper extremity.  Sensation intact to the radial distribution but no  other.  Unable to move her fingers.     RLE with stage II skin to the right buttock, mild surrounding erythema  Neurological: She is alert. She is disoriented.  Skin: Skin is warm and dry. She is not diaphoretic.  Psychiatric: She has a normal mood and affect. Her behavior is normal.  Nursing note and vitals reviewed.    ED Treatments / Results  Labs (all labs ordered are listed, but only abnormal results are displayed) Labs Reviewed  I-STAT CHEM 8, ED - Abnormal; Notable for the following components:      Result Value   BUN 57 (*)    Creatinine, Ser 3.10 (*)    Glucose, Bld 151 (*)    TCO2 20 (*)    Hemoglobin 16.3 (*)    HCT 48.0 (*)    All other components within normal limits  CBC WITH DIFFERENTIAL/PLATELET  BASIC METABOLIC PANEL  CK  URINALYSIS, ROUTINE W REFLEX MICROSCOPIC  HEPARIN LEVEL (UNFRACTIONATED)    EKG EKG Interpretation  Date/Time:  Monday August 29 2018 13:45:33 EST Ventricular Rate:  96 PR Interval:    QRS Duration: 118 QT Interval:  360 QTC Calculation: 455 R Axis:   -67 Text Interpretation:  Sinus rhythm Borderline short PR interval IRBBB and LPFB Since last tracing rate faster Otherwise no significant change Confirmed by Melene Plan [phone]) on 09/18/2018 2:09:55 PM   Radiology Ct Head Wo Contrast  Result Date: 09/17/2018 CLINICAL DATA:  Larey Seat 2 days ago in the home, striking the head. Hematoma in the right periorbital  region. EXAM: CT HEAD WITHOUT CONTRAST TECHNIQUE: Contiguous axial images were obtained from the base of the skull through the vertex without intravenous contrast. COMPARISON:  12/10/2008 MRI FINDINGS: Brain: Background pattern of generalized atrophy. Chronic small-vessel ischemic changes of the white matter. No sign of acute infarction, intra-axial mass lesion, hemorrhage, hydrocephalus or extra-axial collection. The patient has a chronically known meningioma of the planum sphenoidale with calcification measuring 2.4 cm front to back, 12 mm cephalo caudal in 16 mm right to left. This is not significantly changed compared to the MR study of 2010. Vascular: There is atherosclerotic calcification of the major vessels at the base of the brain. Skull: Negative.  No skull fracture. Sinuses/Orbits: Clear/normal Other: Mild soft tissue swelling in the right periorbital soft tissues. IMPRESSION: No acute or traumatic intracranial finding. Age related atrophy and chronic small-vessel ischemic change. 24 x 16 x 12 mm calcified meningioma of the planum sphenoidale, not significantly changed since 2010. Electronically Signed   By: Paulina Fusi M.D.   On: 09/05/2018 14:40    Procedures Procedures (including critical care time) Procedure note: Ultrasound Guided Peripheral IV Ultrasound guided peripheral 1.88 inch angiocath IV placement performed by me. Indications: Nursing unable to place IV. Details: The antecubital fossa and upper arm were evaluated with a multifrequency linear probe. Patent brachial veins were noted. 1 attempt was made to cannulate a vein under realtime US guidance with successful cannulation of the vein and catheter placement. There is return of non-pulsatile dark red blood. The patient tolerated the procedure well without complications. Images archived electronically.  CPT codes: 30865 and [id number]  Medications Ordered in ED Medications  0.9 %  sodium chloride infusion (has no administration in time  range)  0.9 %  sodium chloride infusion (has no administration in time range)  heparin bolus via infusion 4,000 Units (has no administration in time range)  heparin ADULT infusion 100 units/mL (25000 units/275mL sodium chloride 0.45%) (has no  administration in time range)     Initial Impression / Assessment and Plan / ED Course  I have reviewed the triage vital signs and the nursing notes.  Pertinent labs & imaging results that were available during my care of the patient were reviewed by me and considered in my medical decision making (see chart for details).     84 yo F with a chief complaint of having her arm wedged at the bottom of the bed.  I am unable to palpate a pulse.  Was unable to Doppler it as well.  I discussed case with Dr. Darrick Penna from vascular surgery will come and evaluated bedside.  Nursing had difficulty obtaining an IV which led to a delay on her work-up.  We will get an i-STAT Chem-8 give IV fluids.  I suspect that she will have some renal insufficiency from not eating and drinking for the past 48 hours.  The patient does have some renal dysfunction.  Vascular surgery also without distal pulses.  Started on heparin drip likely will go to the OR.  CRITICAL CARE Performed by: Rae Roam   Total critical care time: 35 minutes  Critical care time was exclusive of separately billable procedures and treating other patients.  Critical care was necessary to treat or prevent imminent or life-threatening deterioration.  Critical care was time spent personally by me on the following activities: development of treatment plan with patient and/or surrogate as well as nursing, discussions with consultants, evaluation of patient's response to treatment, examination of patient, obtaining history from patient or surrogate, ordering and performing treatments and interventions, ordering and review of laboratory studies, ordering and review of radiographic studies, pulse oximetry  and re-evaluation of patient's condition.  The patients results and plan were reviewed and discussed.   Any x-rays performed were independently reviewed by myself.   Differential diagnosis were considered with the presenting HPI.  Medications  0.9 %  sodium chloride infusion (has no administration in time range)  0.9 %  sodium chloride infusion (has no administration in time range)  heparin bolus via infusion 4,000 Units (has no administration in time range)  heparin ADULT infusion 100 units/mL (25000 units/242mL sodium chloride 0.45%) (has no administration in time range)    Vitals:   Sep 17, 2018 1348 September 17, 2018 1445 2018/09/17 1500 09/17/2018 1615  BP: (!) 166/87 (!) 169/81 (!) 171/90 (!) 183/84  Pulse: 92   92  Resp: 20 19 (!) 21 20  Temp: (!) 97.5 F (36.4 C)     TempSrc: Oral     SpO2:    100%  Weight:      Height:        Final diagnoses:  Crush injury arm, right, initial encounter  AKI (acute kidney injury) (HCC)    Admission/ observation were discussed with the admitting physician, patient and/or family and they are comfortable with the plan.    Final Clinical Impressions(s) / ED Diagnoses   Final diagnoses:  Crush injury arm, right, initial encounter  AKI (acute kidney injury) [facility])    ED Discharge Orders    None       Melene Plan, DO 2018/09/17 1637    Melene Plan, DO 09/03/2018 [phone]

## 2018-08-29 NOTE — ED Notes (Signed)
Please call Jettie Booze (patient's daughter in law) with update:  [phone]  Sister Thurston [place]) [phone]

## 2018-08-29 NOTE — ED Triage Notes (Signed)
Per EMS, pt is coming from home where she loves alone. Pt stated that she had a fall two days ago while making her bed. Pt stated she hit her head and right shoulder. Pts right arm has faint radial pulses, is pale and has >3 cap refill. Pt alert and oriented x4. VSS.

## 2018-08-29 NOTE — ED Notes (Signed)
Got patient undress on the monitor did ekg shown to Dr Floyd patient is resting with family at bedside and call bell in reach  

## 2018-08-29 NOTE — ED Notes (Signed)
EDP bedside attempting [place] IV

## 2018-08-30 ENCOUNTER — Encounter (HOSPITAL_COMMUNITY): Payer: Self-pay | Admitting: Vascular Surgery

## 2018-08-30 LAB — MRSA PCR SCREENING: MRSA by PCR: NEGATIVE

## 2018-08-30 MED ORDER — LATANOPROST 0.005 % OP SOLN
1.0000 [drp] | Freq: Every day | OPHTHALMIC | Status: DC
  Administered 2018-08-30: 1 [drp] via OPHTHALMIC
  Filled 2018-08-30: qty 2.5

## 2018-08-30 MED ORDER — DOCUSATE SODIUM 100 MG PO CAPS: 100.0000 mg | ORAL_CAPSULE | Freq: Every day | ORAL | Status: DC

## 2018-08-30 MED ORDER — MORPHINE SULFATE (PF) 2 MG/ML IV SOLN: 0.5000 mg | INTRAVENOUS | Status: DC | PRN

## 2018-08-30 MED ORDER — ONDANSETRON HCL 4 MG/2ML IJ SOLN: 4.0000 mg | Freq: Four times a day (QID) | INTRAMUSCULAR | Status: DC | PRN

## 2018-08-30 MED ORDER — MAGNESIUM SULFATE 2 GM/50ML IV SOLN: 2.0000 g | Freq: Every day | INTRAVENOUS | Status: DC | PRN

## 2018-08-30 MED ORDER — QUINAPRIL-HYDROCHLOROTHIAZIDE 10-12.5 MG PO TABS: 1.0000 | ORAL_TABLET | Freq: Every day | ORAL | Status: DC

## 2018-08-30 MED ORDER — ONDANSETRON 4 MG PO TBDP: 4.0000 mg | ORAL_TABLET | Freq: Three times a day (TID) | ORAL | Status: DC | PRN

## 2018-08-30 MED ORDER — POTASSIUM CHLORIDE CRYS ER 20 MEQ PO TBCR: 20.0000 meq | EXTENDED_RELEASE_TABLET | Freq: Every day | ORAL | Status: DC | PRN

## 2018-08-30 MED ORDER — HYDRALAZINE HCL 20 MG/ML IJ SOLN: 5.0000 mg | INTRAMUSCULAR | Status: DC | PRN

## 2018-08-30 MED ORDER — TIMOLOL MALEATE 0.5 % OP SOLN
1.0000 [drp] | Freq: Two times a day (BID) | OPHTHALMIC | Status: DC
  Administered 2018-08-30: 1 [drp] via OPHTHALMIC
  Filled 2018-08-30: qty 5

## 2018-08-30 MED ORDER — SIMVASTATIN 20 MG PO TABS
20.0000 mg | ORAL_TABLET | Freq: Every day | ORAL | Status: DC
  Administered 2018-08-30: 20 mg via ORAL
  Filled 2018-08-30: qty 1

## 2018-08-30 MED ORDER — ACETAMINOPHEN 325 MG PO TABS: 650.0000 mg | ORAL_TABLET | Freq: Two times a day (BID) | ORAL | Status: DC | PRN

## 2018-08-30 MED ORDER — METOPROLOL TARTRATE 5 MG/5ML IV SOLN: 2.0000 mg | INTRAVENOUS | Status: DC | PRN

## 2018-08-30 MED ORDER — BRINZOLAMIDE 1 % OP SUSP
1.0000 [drp] | Freq: Three times a day (TID) | OPHTHALMIC | Status: DC
  Filled 2018-08-30: qty 10

## 2018-08-30 MED ORDER — BISACODYL 5 MG PO TBEC: 5.0000 mg | DELAYED_RELEASE_TABLET | Freq: Every day | ORAL | Status: DC | PRN

## 2018-08-30 MED ORDER — PHENOL 1.4 % MT LIQD: 1.0000 | OROMUCOSAL | Status: DC | PRN

## 2018-08-30 MED ORDER — ALUM & MAG HYDROXIDE-SIMETH 200-200-20 MG/5ML PO SUSP: 15.0000 mL | ORAL | Status: DC | PRN

## 2018-08-30 MED ORDER — ENOXAPARIN SODIUM 40 MG/0.4ML ~~LOC~~ SOLN: 40.0000 mg | SUBCUTANEOUS | Status: DC

## 2018-08-30 MED ORDER — VITAMIN D 1000 UNITS PO TABS: 1000.0000 [IU] | ORAL_TABLET | Freq: Every day | ORAL | Status: DC

## 2018-08-30 MED ORDER — LISINOPRIL 10 MG PO TABS: 10.0000 mg | ORAL_TABLET | Freq: Every day | ORAL | Status: DC

## 2018-08-30 MED ORDER — GUAIFENESIN-DM 100-10 MG/5ML PO SYRP: 15.0000 mL | ORAL_SOLUTION | ORAL | Status: DC | PRN

## 2018-08-30 MED ORDER — CEFAZOLIN SODIUM-DEXTROSE 2-4 GM/100ML-% IV SOLN
2.0000 g | Freq: Three times a day (TID) | INTRAVENOUS | Status: DC
  Administered 2018-08-30: 2 g via INTRAVENOUS
  Filled 2018-08-30 (×2): qty 100

## 2018-08-30 MED ORDER — PANTOPRAZOLE SODIUM 40 MG PO TBEC: 40.0000 mg | DELAYED_RELEASE_TABLET | Freq: Every day | ORAL | Status: DC

## 2018-08-30 MED ORDER — SODIUM CHLORIDE 0.9 % IV SOLN: 500.0000 mL | Freq: Once | INTRAVENOUS | Status: DC | PRN

## 2018-08-30 MED ORDER — OXYCODONE HCL 5 MG PO TABS: 5.0000 mg | ORAL_TABLET | ORAL | Status: DC | PRN

## 2018-08-30 MED ORDER — LABETALOL HCL 5 MG/ML IV SOLN: 10.0000 mg | INTRAVENOUS | Status: DC | PRN

## 2018-08-30 MED ORDER — SENNOSIDES-DOCUSATE SODIUM 8.6-50 MG PO TABS: 1.0000 | ORAL_TABLET | Freq: Every evening | ORAL | Status: DC | PRN

## 2018-09-02 NOTE — Progress Notes (Signed)
Patient came in today to pick up standard Afo brace.  Patient was evaluated for fit and function.   The brace fit very well and there were any complaints of the way it felt once donned.  The brace offered ankle stability in both saggital and coroneal planes.  Patient advised to always wear proper fitting shoes with brace. 

## 2018-09-25 NOTE — Progress Notes (Signed)
Called this morning during code event for patient.  Apparently was doing ok then began to have EKG changes with ST depression followed by ST Elevation and asystole. Pt had been having some ooze from arm incision but not excessive according to pt nurse.  Most likely myocardial event secondary to her injuries and age.  Spoke with pt daughter and gave my condolences.  Fabienne Bruns, MD [facility] Office: [phone] Pager: [phone]

## 2018-09-25 NOTE — Code Documentation (Addendum)
CODE BLUE NOTE  Patient Name: Adrienne Ortiz   MRN: [id number]   Date of Birth/ Sex: 1922-02-17 , female      Admission Date: 08/27/2018  Attending Provider: Sherren Kerns, MD  Primary Diagnosis: AKI (acute kidney injury) [facility]) [N17.9] Crush injury arm, right, initial encounter [S47.1XXA] Nontraumatic ischemic infarction of muscle of right upper arm [M62.221]    Indication: Pt was in her usual state of health until this AM, when she was noted to have EKG changes and then asystole. Code blue was subsequently called. At the time of arrival on scene, ACLS protocol was underway.   Technical Description:  - CPR performance duration:  15 minutes  - Was defibrillation or cardioversion used? Yes   - Was external pacer placed? No  - Was patient intubated pre/post CPR? Yes     Medications Administered: Y = Yes; Blank = No Amiodarone    Atropine    Calcium    Epinephrine  Y  Lidocaine    Magnesium    Norepinephrine    Phenylephrine    Sodium bicarbonate  Y  Vasopressin    Other     Post CPR evaluation:  - Final Status - Was patient successfully resuscitated ? No   Miscellaneous Information:  - Time of death:  16 AM  - Primary team notified?  Yes  - Family Notified? Yes     Spoke to [employer], patient's daughter about continuation of chest compressions as patient remained unresponsive to approximately 10 minutes of compressions. [name] requested discontinuation of compressions. This was relayed to ACLS team and compressions were discontinued at time above.   Melene Plan, MD   09/05/18, 5:25 AM

## 2018-09-25 NOTE — Discharge Summary (Signed)
[facility] Discharge Summary   Patient ID:  Adrienne Ortiz MRN: [id number] DOB/AGE: 1922-08-28 84 y.o.  Admit date: 09/23/2018 Discharge date: 2018/09/02 Date of Surgery: 09/02/2018 Surgeon: Surgeon(s): Fields, Janetta Hora, MD Knute Neu, MD  Admission Diagnosis: AKI (acute kidney injury) [facility]) [N17.9] Crush injury arm, right, initial encounter [S47.1XXA] Nontraumatic ischemic infarction of muscle of right upper arm [M62.221]  Discharge Diagnoses:  AKI (acute kidney injury) (HCC) [N17.9] Crush injury arm, right, initial encounter [S47.1XXA] Nontraumatic ischemic infarction of muscle of right upper arm [M62.221]  Secondary Diagnoses: Past Medical History:  Diagnosis Date  . Anemia    OF CHRONIC DISEASE  . Arthritis   . Chronic renal insufficiency   . Diabetes mellitus without complication (HCC)   . Dysphagia   . Glaucoma   . Hyperlipidemia   . Hypertension   . Insomnia   . Osteopenia   . Peripheral neuropathy   . Reflux    HOARSENESS  . Restless leg syndrome   . Vitamin D deficiency     Procedure(s): THROMBECTOMY of  RIGHT BRACHIAL ARTERY AND RIGHT AXILLARY ARTERY  Discharged Condition: Deceased  HPI: Adrienne Ortiz is a 84 y.o. female, with 2 day history of right arm wedged between bed and wall to arm pit level.  Pt hand was noted to be cool but slightly improved in ER.  She cannot move the right hand or wrist or elbow.  She is slightly confused to situation but is able to follow commands most of history provided by family. Other medical problems include diabetes peripheral neuropathy hypertension hyperlipidemia which have been stable.  She complains of some right hip and back pain.  According to family she was very functional and alert prior to this and driving on her own.  Apparently her left leg was also bent behind her in an awkward position.  She was taken to the OR emergently by Dr. Darrick Penna for right UE ischemia.   Hospital Course:   Adrienne Ortiz is a 84 y.o. female is S/P  Procedure(s): THROMBECTOMY of  RIGHT BRACHIAL ARTERY AND RIGHT AXILLARY ARTERY   Consults:  Treatment Team:  Sherren Kerns, MD   Right arm perfused with palpable radial and ulnar pulses were successful.  Code blue  was called.  Spoke to [employer], patient's daughter about continuation of chest compressions as patient remained unresponsive to approximately 10 minutes of compressions. [name] requested discontinuation of compressions. This was relayed to ACLS team and compressions were discontinued at 0510.    Significant Diagnostic Studies: CBC Lab Results  Component Value Date   WBC 17.5 (H) 09/21/2018   HGB 16.3 (H) 09/10/2018   HCT 48.0 (H) 09/19/2018   MCV 89.3 09/23/2018   PLT 270 09/09/2018    BMET    Component Value Date/Time   NA 137 09/03/2018 1559   K 4.2 09/02/2018 1559   CL 105 09/23/2018 1559   CO2 20 (L) 04/08/2017 1358   GLUCOSE 151 (H) 08/27/2018 1559   BUN 57 (H) 09/20/2018 1559   CREATININE 3.10 (H) 09/15/2018 1559   CALCIUM 8.7 (L) 04/08/2017 1358   GFRNONAA 38 (L) 04/08/2017 1358   GFRAA 45 (L) 04/08/2017 1358   COAG No results found for: INR, PROTIME   Disposition:  Discharge to :Deceased  Allergies as of 09/02/18      Reactions   Celebrex [celecoxib] Swelling   Oxaprozin Swelling   Penicillins Itching   Has patient had a PCN reaction causing immediate  rash, facial/tongue/throat swelling, SOB or lightheadedness with hypotension: No Has patient had a PCN reaction causing severe rash involving mucus membranes or skin necrosis: No Has patient had a PCN reaction that required hospitalization: No Has patient had a PCN reaction occurring within the last 10 years: No If all of the above answers are "NO", then may proceed with Cephalosporin use.   Aspirin Other (See Comments)   Uncoated clear liquid come up in her mouth if taken on an empty stomach      Medication List    ASK your doctor  about these medications   acetaminophen 325 MG tablet Commonly known as:  TYLENOL Take 650 mg by mouth 2 (two) times daily as needed for moderate pain (arthiritis).   brinzolamide 1 % ophthalmic suspension Commonly known as:  AZOPT Place 1 drop into the right eye 3 (three) times daily.   cholecalciferol 1000 units tablet Commonly known as:  VITAMIN D Take 1,000 Units by mouth daily.   ciprofloxacin 500 MG tablet Commonly known as:  CIPRO Take 1 tablet (500 mg total) by mouth 2 (two) times daily. X 10 DAYS   latanoprost 0.005 % ophthalmic solution Commonly known as:  XALATAN Place 1 drop into the right eye at bedtime.   metroNIDAZOLE 500 MG tablet Commonly known as:  FLAGYL Take 1 tablet (500 mg total) by mouth 3 (three) times daily. X 10 DAYS   omeprazole 40 MG capsule Commonly known as:  PRILOSEC Take 40 mg by mouth daily.   ondansetron 4 MG disintegrating tablet Commonly known as:  ZOFRAN-ODT Take 1 tablet (4 mg total) by mouth every 8 (eight) hours as needed for nausea or vomiting.   quinapril 10 MG tablet Commonly known as:  ACCUPRIL Take 1 tablet (10 mg total) by mouth daily.   quinapril-hydrochlorothiazide 10-12.5 MG tablet Commonly known as:  ACCURETIC Take 1 tablet by mouth daily. HOLD for a week.   simvastatin 20 MG tablet Commonly known as:  ZOCOR Take 20 mg by mouth at bedtime.   SYSTANE 0.4-0.3 % Soln Generic drug:  Polyethyl Glycol-Propyl Glycol Apply to eye as needed.   timolol 0.5 % ophthalmic solution Commonly known as:  BETIMOL Place 1 drop into the right eye 2 (two) times daily.      Verbal and written Discharge instructions given to the patient. Wound care per Discharge AVS   Signed: Mosetta Pigeon 08/31/2018, 9:58 AM

## 2018-09-25 DEATH — deceased

## 2018-10-04 ENCOUNTER — Ambulatory Visit: Payer: Medicare Other | Admitting: Sports Medicine
